# Patient Record
Sex: Male | Born: 1995 | Race: Black or African American | Hispanic: No | Marital: Single | Smoking: Current every day smoker
Health system: Southern US, Community
[De-identification: ages and names within clinical notes are randomized; demographics above are authoritative.]

## PROBLEM LIST (undated history)

## (undated) DIAGNOSIS — Q549 Hypospadias, unspecified: Secondary | ICD-10-CM

## (undated) DIAGNOSIS — J45909 Unspecified asthma, uncomplicated: Secondary | ICD-10-CM

## (undated) HISTORY — PX: HYPOSPADIAS CORRECTION: SHX483

## (undated) HISTORY — PX: COSMETIC SURGERY: SHX468

## (undated) HISTORY — PX: FINGER SURGERY: SHX640

---
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
## 2015-03-10 ENCOUNTER — Encounter (HOSPITAL_COMMUNITY): Payer: Self-pay | Admitting: Emergency Medicine

## 2015-03-10 ENCOUNTER — Emergency Department (INDEPENDENT_AMBULATORY_CARE_PROVIDER_SITE_OTHER)
Admission: EM | Admit: 2015-03-10 | Discharge: 2015-03-10 | Disposition: A | Discharge status: Home / Self Care | Payer: Medicaid Other | Source: Home / Self Care | Attending: Emergency Medicine | Admitting: Emergency Medicine

## 2015-03-10 DIAGNOSIS — J029 Acute pharyngitis, unspecified: Secondary | ICD-10-CM | POA: Diagnosis not present

## 2015-03-10 LAB — POCT RAPID STREP A: Streptococcus, Group A Screen (Direct): NEGATIVE

## 2015-03-10 MED ORDER — AMOXICILLIN 500 MG PO CAPS: 1000.0000 mg | ORAL_CAPSULE | Freq: Two times a day (BID) | ORAL | Status: DC

## 2015-03-10 NOTE — Discharge Instructions (Signed)
Pharyngitis Pharyngitis is redness, pain, and swelling (inflammation) of your pharynx.  CAUSES  Pharyngitis is usually caused by infection. Most of the time, these infections are from viruses (viral) and are part of a cold. However, sometimes pharyngitis is caused by bacteria (bacterial). Pharyngitis can also be caused by allergies. Viral pharyngitis may be spread from person to person by coughing, sneezing, and personal items or utensils (cups, forks, spoons, toothbrushes). Bacterial pharyngitis may be spread from person to person by more intimate contact, such as kissing.  SIGNS AND SYMPTOMS  Symptoms of pharyngitis include:   Sore throat.   Tiredness (fatigue).   Low-grade fever.   Headache.  Joint pain and muscle aches.  Skin rashes.  Swollen lymph nodes.  Plaque-like film on throat or tonsils (often seen with bacterial pharyngitis). DIAGNOSIS  Your health care provider will ask you questions about your illness and your symptoms. Your medical history, along with a physical exam, is often all that is needed to diagnose pharyngitis. Sometimes, a rapid strep test is done. Other lab tests may also be done, depending on the suspected cause.  TREATMENT  Viral pharyngitis will usually get better in 3-4 days without the use of medicine. Bacterial pharyngitis is treated with medicines that kill germs (antibiotics).  HOME CARE INSTRUCTIONS   Drink enough water and fluids to keep your urine clear or pale yellow.   Only take over-the-counter or prescription medicines as directed by your health care provider:   If you are prescribed antibiotics, make sure you finish them even if you start to feel better.   Do not take aspirin.   Get lots of rest.   Gargle with 8 oz of salt water ( tsp of salt per 1 qt of water) as often as every 1-2 hours to soothe your throat.   Throat lozenges (if you are not at risk for choking) or sprays may be used to soothe your throat. SEEK MEDICAL  CARE IF:   You have large, tender lumps in your neck.  You have a rash.  You cough up green, yellow-brown, or bloody spit. SEEK IMMEDIATE MEDICAL CARE IF:   Your neck becomes stiff.  You drool or are unable to swallow liquids.  You vomit or are unable to keep medicines or liquids down.  You have severe pain that does not go away with the use of recommended medicines.  You have trouble breathing (not caused by a stuffy nose). MAKE SURE YOU:   Understand these instructions.  Will watch your condition.  Will get help right away if you are not doing well or get worse.   This information is not intended to replace advice given to you by your health care provider. Make sure you discuss any questions you have with your health care provider.   Document Released: 03/21/2005 Document Revised: 01/09/2013 Document Reviewed: 11/26/2012 Elsevier Interactive Patient Education 2016 [employer]  Sore Throat A sore throat is pain, burning, irritation, or scratchiness of the throat. There is often pain or tenderness when swallowing or talking. A sore throat may be accompanied by other symptoms, such as coughing, sneezing, fever, and swollen neck glands. A sore throat is often the first sign of another sickness, such as a cold, flu, strep throat, or mononucleosis (commonly known as mono). Most sore throats go away without medical treatment. CAUSES  The most common causes of a sore throat include:  A viral infection, such as a cold, flu, or mono.  A bacterial infection, such as strep throat,  tonsillitis, or whooping cough.  Seasonal allergies.  Dryness in the air.  Irritants, such as smoke or pollution.  Gastroesophageal reflux disease (GERD). HOME CARE INSTRUCTIONS   Only take over-the-counter medicines as directed by your caregiver.  Drink enough fluids to keep your urine clear or pale yellow.  Rest as needed.  Try using throat sprays, lozenges, or sucking on hard candy to ease  any pain (if older than 4 years or as directed).  Sip warm liquids, such as broth, herbal tea, or warm water with honey to relieve pain temporarily. You may also eat or drink cold or frozen liquids such as frozen ice pops.  Gargle with salt water (mix 1 tsp salt with 8 oz of water).  Do not smoke and avoid secondhand smoke.  Put a cool-mist humidifier in your bedroom at night to moisten the air. You can also turn on a hot shower and sit in the bathroom with the door closed for 5-10 minutes. SEEK IMMEDIATE MEDICAL CARE IF:  You have difficulty breathing.  You are unable to swallow fluids, soft foods, or your saliva.  You have increased swelling in the throat.  Your sore throat does not get better in 7 days.  You have nausea and vomiting.  You have a fever or persistent symptoms for more than 2-3 days.  You have a fever and your symptoms suddenly get worse. MAKE SURE YOU:   Understand these instructions.  Will watch your condition.  Will get help right away if you are not doing well or get worse.   This information is not intended to replace advice given to you by your health care provider. Make sure you discuss any questions you have with your health care provider.   Document Released: 04/28/2004 Document Revised: 04/11/2014 Document Reviewed: 11/27/2011 Elsevier Interactive Patient Education 2016 [employer]  Upper Respiratory Infection, Adult Most upper respiratory infections (URIs) are a viral infection of the air passages leading to the lungs. A URI affects the nose, throat, and upper air passages. The most common type of URI is nasopharyngitis and is typically referred to as "the common cold." URIs run their course and usually go away on their own. Most of the time, a URI does not require medical attention, but sometimes a bacterial infection in the upper airways can follow a viral infection. This is called a secondary infection. Sinus and middle ear infections are  common types of secondary upper respiratory infections. Bacterial pneumonia can also complicate a URI. A URI can worsen asthma and chronic obstructive pulmonary disease (COPD). Sometimes, these complications can require emergency medical care and may be life threatening.  CAUSES Almost all URIs are caused by viruses. A virus is a type of germ and can spread from one person to another.  RISKS FACTORS You may be at risk for a URI if:   You smoke.   You have chronic heart or lung disease.  You have a weakened defense (immune) system.   You are very young or very old.   You have nasal allergies or asthma.  You work in crowded or poorly ventilated areas.  You work in health care facilities or schools. SIGNS AND SYMPTOMS  Symptoms typically develop 2-3 days after you come in contact with a cold virus. Most viral URIs last 7-10 days. However, viral URIs from the influenza virus (flu virus) can last 14-18 days and are typically more severe. Symptoms may include:   Runny or stuffy (congested) nose.   Sneezing.  Cough.   Sore throat.   Headache.   Fatigue.   Fever.   Loss of appetite.   Pain in your forehead, behind your eyes, and over your cheekbones (sinus pain).  Muscle aches.  DIAGNOSIS  Your health care provider may diagnose a URI by:  Physical exam.  Tests to check that your symptoms are not due to another condition such as:  Strep throat.  Sinusitis.  Pneumonia.  Asthma. TREATMENT  A URI goes away on its own with time. It cannot be cured with medicines, but medicines may be prescribed or recommended to relieve symptoms. Medicines may help:  Reduce your fever.  Reduce your cough.  Relieve nasal congestion. HOME CARE INSTRUCTIONS   Take medicines only as directed by your health care provider.   Gargle warm saltwater or take cough drops to comfort your throat as directed by your health care provider.  Use a warm mist humidifier or inhale  steam from a shower to increase air moisture. This may make it easier to breathe.  Drink enough fluid to keep your urine clear or pale yellow.   Eat soups and other clear broths and maintain good nutrition.   Rest as needed.   Return to work when your temperature has returned to normal or as your health care provider advises. You may need to stay home longer to avoid infecting others. You can also use a face mask and careful hand washing to prevent spread of the virus.  Increase the usage of your inhaler if you have asthma.   Do not use any tobacco products, including cigarettes, chewing tobacco, or electronic cigarettes. If you need help quitting, ask your health care provider. PREVENTION  The best way to protect yourself from getting a cold is to practice good hygiene.   Avoid oral or hand contact with people with cold symptoms.   Wash your hands often if contact occurs.  There is no clear evidence that vitamin C, vitamin E, echinacea, or exercise reduces the chance of developing a cold. However, it is always recommended to get plenty of rest, exercise, and practice good nutrition.  SEEK MEDICAL CARE IF:   You are getting worse rather than better.   Your symptoms are not controlled by medicine.   You have chills.  You have worsening shortness of breath.  You have brown or red mucus.  You have yellow or brown nasal discharge.  You have pain in your face, especially when you bend forward.  You have a fever.  You have swollen neck glands.  You have pain while swallowing.  You have white areas in the back of your throat. SEEK IMMEDIATE MEDICAL CARE IF:   You have severe or persistent:  Headache.  Ear pain.  Sinus pain.  Chest pain.  You have chronic lung disease and any of the following:  Wheezing.  Prolonged cough.  Coughing up blood.  A change in your usual mucus.  You have a stiff neck.  You have changes in  your:  Vision.  Hearing.  Thinking.  Mood. MAKE SURE YOU:   Understand these instructions.  Will watch your condition.  Will get help right away if you are not doing well or get worse.   This information is not intended to replace advice given to you by your health care provider. Make sure you discuss any questions you have with your health care provider.   Document Released: 09/14/2000 Document Revised: 08/05/2014 Document Reviewed: 06/26/2013 Elsevier Interactive Patient Education 2016 Elsevier  Inc. ° °

## 2015-03-10 NOTE — ED Notes (Signed)
C/o intermittent ST onset 12/01 associated w/neck pain and HA Currently, pt denies pain A&O x4... No acute distress.

## 2015-03-10 NOTE — ED Provider Notes (Signed)
CSN: [id number]     Arrival date & time 03/10/15  1850 History   First MD Initiated Contact with Patient 03/10/15 1926     Chief Complaint  Patient presents with  . Sore Throat   (Consider location/radiation/quality/duration/timing/severity/associated sxs/prior Treatment) HPI Comments: 19 year old male is complaining of a sore throat and fever. Approximate 5 days ago he developed soreness in the paracervical musculature. This was before he became ill. The fever started yesterday and the throat pain developed shortly after. Complains of fatigue and malaise, headache, neck soreness but not stiffness and sore throat.   Past Medical History  Diagnosis Date  . Hypospadias    Past Surgical History  Procedure Laterality Date  . Finger surgery    . Cosmetic surgery    . Hypospadias correction     No family history on file. Social History  Substance Use Topics  . Smoking status: Never Smoker   . Smokeless tobacco: None  . Alcohol Use: No    Review of Systems  Constitutional: Positive for fever, activity change, appetite change and fatigue. Negative for diaphoresis.  HENT: Positive for postnasal drip and sore throat. Negative for ear pain, facial swelling, rhinorrhea and trouble swallowing.   Eyes: Negative for pain, discharge and redness.  Respiratory: Positive for cough. Negative for chest tightness and shortness of breath.   Cardiovascular: Negative.   Gastrointestinal: Negative.   Genitourinary: Negative.   Musculoskeletal: Negative.  Negative for neck pain and neck stiffness.  Skin: Negative for rash.  Neurological: Negative.     Allergies  Review of patient's allergies indicates no known allergies.  Home Medications   Prior to Admission medications   Medication Sig Start Date End Date Taking? Authorizing Provider  amoxicillin (AMOXIL) 500 MG capsule Take 2 capsules (1,000 mg total) by mouth 2 (two) times daily. 03/10/15   Hayden Rasmussenavid Dulcemaria Bula, NP  cetirizine (ZYRTEC ALLERGY) 10 MG  tablet Take 1 tablet (10 mg total) by mouth daily. 09/13/13   Kaitlyn Szekalski, PA-C  ibuprofen (ADVIL,MOTRIN) 600 MG tablet Take 1 tablet (600 mg total) by mouth every 6 (six) hours as needed. 09/13/13   Emilia BeckKaitlyn Szekalski, PA-C   Meds Ordered and Administered this Visit  Medications - No data to display  BP 126/75 mmHg  Pulse 75  Temp(Src) 102.1 F (38.9 C) (Oral)  Resp 16  SpO2 100% No data found.   Physical Exam  Constitutional: He is oriented to person, place, and time. He appears well-developed and well-nourished. No distress.  HENT:  Mouth/Throat: Oropharyngeal exudate present.  Bilateral TMs are normal. Oropharynx with deep erythema, mild swelling, tonsillar exudates and curtains of gray thick PND.   Neck: Normal range of motion. Neck supple.  Bilateral anterior cervical adenopathy  Cardiovascular: Normal rate, regular rhythm and normal heart sounds.   Pulmonary/Chest: Effort normal and breath sounds normal. No respiratory distress. He has no wheezes. He has no rales.  Musculoskeletal: Normal range of motion. He exhibits no edema.  Lymphadenopathy:    He has cervical adenopathy.  Neurological: He is alert and oriented to person, place, and time.  Skin: Skin is warm and dry. No rash noted.  Psychiatric: He has a normal mood and affect.  Nursing note and vitals reviewed.   ED Course  Procedures (including critical care time)  Labs Review Labs Reviewed - No data to display  Imaging Review No results found.   Visual Acuity Review  Right Eye Distance:   Left Eye Distance:   Bilateral Distance:    Right  Eye Near:   Left Eye Near:    Bilateral Near:         MDM   1. Exudative pharyngitis    Patient with exudative pharyngitis associated with fever of 102.1, anterior lymphadenopathy treated with amoxicillin, Cepacol lozenges, ibuprofen 600 mg every 6-8 hours and lots of fluids.       Hayden Rasmussen, NP 03/10/15 [phone]

## 2015-03-14 LAB — CULTURE, GROUP A STREP

## 2015-07-31 ENCOUNTER — Ambulatory Visit (HOSPITAL_COMMUNITY)
Admission: EM | Admit: 2015-07-31 | Discharge: 2015-07-31 | Disposition: A | Discharge status: Home / Self Care | Payer: Self-pay | Attending: Family Medicine | Admitting: Family Medicine

## 2015-07-31 ENCOUNTER — Ambulatory Visit (INDEPENDENT_AMBULATORY_CARE_PROVIDER_SITE_OTHER): Payer: Self-pay

## 2015-07-31 ENCOUNTER — Encounter (HOSPITAL_COMMUNITY): Payer: Self-pay

## 2015-07-31 DIAGNOSIS — K219 Gastro-esophageal reflux disease without esophagitis: Secondary | ICD-10-CM

## 2015-07-31 DIAGNOSIS — R0789 Other chest pain: Secondary | ICD-10-CM

## 2015-07-31 MED ORDER — OMEPRAZOLE 20 MG PO TBEC: 20.0000 mg | DELAYED_RELEASE_TABLET | Freq: Every day | ORAL | Status: DC

## 2015-07-31 NOTE — Discharge Instructions (Signed)
It is a pleasure to see you today.  The chest x-ray does not show any abnormalities or rib fractures that would explain your chest wall symptoms.   Regarding the throat symptoms, I believe there may be a component that is related to your acid reflux.  I would like to you to take an acid-suppressing medication, OMEPRAZOLE 20mg  by mouth once daily, for the coming 2 weeks.   Follow up with your newly established primary care doctor for these issues, and to see if the acid blocking medicine has helped.

## 2015-07-31 NOTE — ED Notes (Signed)
Patient states he has a funny feeling in his chest and his tonsils feel swollen x2 days. Patient states there is no pain just a funny feeling as if he has to take a deep breath just to get some air. No acute distress

## 2015-07-31 NOTE — ED Provider Notes (Addendum)
CSN: [id number]     Arrival date & time 07/31/15  1336 History   First MD Initiated Contact with Patient 07/31/15 1512     Chief Complaint  Patient presents with  . Chest Pain   (Consider location/radiation/quality/duration/timing/severity/associated sxs/prior Treatment) Patient is a 20 y.o. male presenting with chest pain. The history is provided by the patient. No language interpreter was used.  Chest Pain Associated symptoms: cough   Associated symptoms: no diaphoresis, no dizziness, no fatigue, no fever, no headache, no nausea, no numbness, no shortness of breath and not vomiting   Patient presents with complaint of left-sided chest wall soreness ("strange feeling"), not truly painful but is uncomfortable.  Has been present since @February  2017, no clear onset or trigger to the complaint. He plays college basketball and believes he may have been hit while playing but does not recall a specific event.  Will have occasional dry cough, sometimes productive of clear sputum, no blood. No dyspnea.  No history lung disease. Nonsmoker.  No fevers/chills/sweats.    He also notes a "feeling like my throat is swollen", not a sore or painful throat. No hoarseness or change in phonation, no aspiration, no dysphagia or odynophagia.  Eating normally, appetite normal.   Has had some nasal congestion; also long history GERD symptoms which have not changed since childhood.   No chronic medications, NKDA.   Past Medical History  Diagnosis Date  . Hypospadias    Past Surgical History  Procedure Laterality Date  . Finger surgery    . Cosmetic surgery    . Hypospadias correction     No family history on file. Social History  Substance Use Topics  . Smoking status: Never Smoker   . Smokeless tobacco: Never Used  . Alcohol Use: No    Review of Systems  Constitutional: Negative for fever, chills, diaphoresis, appetite change and fatigue.  HENT: Positive for congestion. Negative for ear discharge,  ear pain, sinus pressure and sore throat.   Respiratory: Positive for cough. Negative for apnea, choking, chest tightness, shortness of breath, wheezing and stridor.   Cardiovascular: Positive for chest pain.  Gastrointestinal: Negative for nausea, vomiting, diarrhea and constipation.  Neurological: Negative for dizziness, numbness and headaches.    Allergies  Review of patient's allergies indicates no known allergies.  Home Medications   Prior to Admission medications   Medication Sig Start Date End Date Taking? Authorizing Provider  amoxicillin (AMOXIL) 500 MG capsule Take 2 capsules (1,000 mg total) by mouth 2 (two) times daily. 03/10/15   Hayden Rasmussenavid Mabe, NP  cetirizine (ZYRTEC ALLERGY) 10 MG tablet Take 1 tablet (10 mg total) by mouth daily. 09/13/13   Kaitlyn Szekalski, PA-C  ibuprofen (ADVIL,MOTRIN) 600 MG tablet Take 1 tablet (600 mg total) by mouth every 6 (six) hours as needed. 09/13/13   Emilia BeckKaitlyn Szekalski, PA-C   Meds Ordered and Administered this Visit  Medications - No data to display  BP 145/91 mmHg  Pulse 60  Temp(Src) 98.3 F (36.8 C) (Oral)  Resp 12  SpO2 100% No data found.   Physical Exam  Constitutional: He appears well-developed and well-nourished. No distress.  HENT:  Head: Normocephalic and atraumatic.  Right Ear: External ear normal.  Left Ear: External ear normal.  Mouth/Throat: Oropharynx is clear and moist. No oropharyngeal exudate.  Boggy nasal mucosa. More so in the R than L.   No frontal or maxillary sinus tenderness.   Eyes: Conjunctivae and EOM are normal. Pupils are equal, round, and reactive to  light. Right eye exhibits no discharge. Left eye exhibits no discharge. No scleral icterus.  Neck: Normal range of motion. Neck supple. No tracheal deviation present. No thyromegaly present.  Cardiovascular: Normal rate, regular rhythm and normal heart sounds.  Exam reveals no gallop and no friction rub.   No murmur heard. Pulmonary/Chest: Effort normal and  breath sounds normal. No respiratory distress. He has no wheezes. He has no rales.  Question mild tenderness along anterior aspect of L chest wall at @ rib 6-8. No step-off, no erythema or skin changes.   Good air movement on auscultation throughout lung fields bilaterally.    Abdominal: Soft. Bowel sounds are normal. He exhibits no distension and no mass. There is no tenderness. There is no rebound and no guarding.  Lymphadenopathy:    He has no cervical adenopathy.  Skin: He is not diaphoretic.    ED Course  Procedures (including critical care time)  Labs Review Labs Reviewed - No data to display  Imaging Review No results found.   Visual Acuity Review  Right Eye Distance:   Left Eye Distance:   Bilateral Distance:    Right Eye Near:   Left Eye Near:    Bilateral Near:         MDM   1. Left-sided chest wall pain   2. Gastroesophageal reflux disease without esophagitis    Patient with L sided chest soreness.  CXR today in [facility] (reviewed by me) shows no bony or pulmonary abnormalities to explain his sxs.   Throat sensation, may be related to chronic GERD versus allergic.  Trial PPI and follow up with PCP (he is going to establish today).  He tells me he is establishing a Primary physician today; his mother has a list of doctors for him to call.  Plan to follow up for both of these problems with his new PCP, or to return to [facility] as needed if worsens/new problems develop.       Barbaraann Barthel, MD 07/31/15 1534  Barbaraann Barthel, MD 07/31/15 [phone]

## 2017-02-09 ENCOUNTER — Emergency Department (HOSPITAL_COMMUNITY)
Admission: EM | Admit: 2017-02-09 | Discharge: 2017-02-10 | Disposition: A | Discharge status: Home / Self Care | Payer: Self-pay | Attending: Emergency Medicine | Admitting: Emergency Medicine

## 2017-02-09 ENCOUNTER — Encounter (HOSPITAL_COMMUNITY): Payer: Self-pay | Admitting: Emergency Medicine

## 2017-02-09 DIAGNOSIS — Z202 Contact with and (suspected) exposure to infections with a predominantly sexual mode of transmission: Secondary | ICD-10-CM | POA: Insufficient documentation

## 2017-02-09 DIAGNOSIS — Q386 Other congenital malformations of mouth: Secondary | ICD-10-CM

## 2017-02-09 DIAGNOSIS — F1290 Cannabis use, unspecified, uncomplicated: Secondary | ICD-10-CM | POA: Insufficient documentation

## 2017-02-09 DIAGNOSIS — N4889 Other specified disorders of penis: Secondary | ICD-10-CM | POA: Insufficient documentation

## 2017-02-09 DIAGNOSIS — Z711 Person with feared health complaint in whom no diagnosis is made: Secondary | ICD-10-CM

## 2017-02-09 DIAGNOSIS — J45909 Unspecified asthma, uncomplicated: Secondary | ICD-10-CM | POA: Insufficient documentation

## 2017-02-09 HISTORY — DX: Unspecified asthma, uncomplicated: J45.909

## 2017-02-09 LAB — URINALYSIS, ROUTINE W REFLEX MICROSCOPIC
Bilirubin Urine: NEGATIVE
Glucose, UA: NEGATIVE mg/dL
Hgb urine dipstick: NEGATIVE
Ketones, ur: NEGATIVE mg/dL
Leukocytes, UA: NEGATIVE
Nitrite: NEGATIVE
Protein, ur: NEGATIVE mg/dL
Specific Gravity, Urine: 1.028 (ref 1.005–1.030)
pH: 6 (ref 5.0–8.0)

## 2017-02-09 NOTE — ED Triage Notes (Signed)
Pt requesting STD screen, denies discharge or painful urination. Unsure if he has come in contact with anyone who has an STD, but he has noticed "bumps" on his genitals.

## 2017-02-10 LAB — HIV ANTIBODY (ROUTINE TESTING W REFLEX): HIV Screen 4th Generation wRfx: NONREACTIVE

## 2017-02-10 LAB — RPR: RPR Ser Ql: NONREACTIVE

## 2017-02-10 NOTE — ED Provider Notes (Signed)
[facility] Note   CSN: [id number] Arrival date & time: 02/09/17  2202     History   Chief Complaint Chief Complaint  Patient presents with  . Exposure to STD    HPI Johnny Garza is a 21 y.o. male.  Johnny KluverMason D Garza is a 21 y.o. Male who presents to the ED complaining of some bumps on his penis for about the past 5 months.  He reports they are not painful or itchy.  He has no symptoms with them.  Sometimes he can feel them on the underside of his penis.  He denies other rashes or lesions.  No penile discharge.  No testicular pain.  He reports he is sexually active and does not use protection.  He denies history of STD.  He denies fevers, mouth sores, penile discharge, dysuria, penile or testicular pain.   The history is provided by the patient and medical records. No language interpreter was used.  Exposure to STD  Pertinent negatives include no abdominal pain.    Past Medical History:  Diagnosis Date  . Asthma   . Hypospadias     There are no active problems to display for this patient.   Past Surgical History:  Procedure Laterality Date  . COSMETIC SURGERY    . FINGER SURGERY    . HYPOSPADIAS CORRECTION         Home Medications    Prior to Admission medications   Not on File    Family History No family history on file.  Social History Social History   Tobacco Use  . Smoking status: Never Smoker  . Smokeless tobacco: Never Used  Substance Use Topics  . Alcohol use: No  . Drug use: Yes    Types: Marijuana     Allergies   Patient has no known allergies.   Review of Systems Review of Systems  Constitutional: Negative for fever.  HENT: Negative for mouth sores.   Respiratory: Negative for cough.   Gastrointestinal: Negative for abdominal pain, nausea and rectal pain.  Genitourinary: Negative for difficulty urinating, discharge, dysuria, hematuria, penile pain, penile swelling, scrotal swelling,  testicular pain and urgency.  Skin: Positive for rash. Negative for color change.     Physical Exam Updated Vital Signs BP (!) 161/89 (BP Location: Right Arm)   Pulse 100   Temp 98.3 F (36.8 C) (Oral)   Resp 16   Ht 5\' 11"  (1.803 m)   Wt 68 kg (150 lb)   SpO2 100%   BMI 20.92 kg/m   Physical Exam  Constitutional: He appears well-developed and well-nourished. No distress.  HENT:  Head: Normocephalic and atraumatic.  Mouth/Throat: Oropharynx is clear and moist.  Eyes: Right eye exhibits no discharge. Left eye exhibits no discharge.  Neck: Neck supple.  Cardiovascular: Normal rate, regular rhythm and normal heart sounds.  Pulmonary/Chest: Effort normal and breath sounds normal. No respiratory distress.  Abdominal: Soft. There is no tenderness. There is no guarding.  Genitourinary: Penis normal. No penile tenderness.  Genitourinary Comments: GU exam with male RN as chaperone.  No evidence of any lesions or rashes.  Possible slight fordyce spots on the underside of his penis. He is unable to point out the bumps that he came in for. No vesicles.  No penile or testicular tenderness to palpation.  No penile discharge.  Lymphadenopathy:    He has no cervical adenopathy.  Neurological: He is alert. Coordination normal.  Skin: Skin is warm and  dry. No rash noted. He is not diaphoretic. No erythema. No pallor.  Psychiatric: He has a normal mood and affect. His behavior is normal.  Nursing note and vitals reviewed.    ED Treatments / Results  Labs (all labs ordered are listed, but only abnormal results are displayed) Labs Reviewed  URINALYSIS, ROUTINE W REFLEX MICROSCOPIC  RPR  HIV ANTIBODY (ROUTINE TESTING)  GC/CHLAMYDIA PROBE AMP ([place]    EKG  EKG Interpretation None       Radiology No results found.  Procedures Procedures (including critical care time)  Medications Ordered in ED Medications - No data to display   Initial Impression /  Assessment and Plan / ED Course  I have reviewed the triage vital signs and the nursing notes.  Pertinent labs & imaging results that were available during my care of the patient were reviewed by me and considered in my medical decision making (see chart for details).    This is a 21 y.o. Male who presents to the ED complaining of some bumps on his penis for about the past 5 months.  He reports they are not painful or itchy.  He has no symptoms with them.  Sometimes he can feel them on the underside of his penis.  He denies other rashes or lesions.  No penile discharge.  No testicular pain.  He reports he is sexually active and does not use protection.  He denies history of STD. On exam the patient is afebrile nontoxic-appearing.  His abdomen is soft and nontender to palpation.  No mouth sores noted.  On GU exam patient possibly has some fordyce spots.  No vesicles.  No penile or testicular tenderness to palpation.  No penile discharge.  No evidence of syphilis or herpes.  He has no symptoms of an STD currently.  I see no need for any treatment at this time.  I reassured him about these fordyce spots. he agrees to be checked for HIV, syphilis, gonorrhea and chlamydia.  I encouraged him to follow-up on these test results in about 3 days and my chart.  I discussed extensively safe sex practices with the patient.  I encouraged him to always use a condom.  I encouraged him to follow-up the health department for routine STD examinations.  Patient agrees with plan. I advised the patient to follow-up with their primary care provider this week. I advised the patient to return to the emergency department with new or worsening symptoms or new concerns. The patient verbalized understanding and agreement with plan.    Final Clinical Impressions(s) / ED Diagnoses   Final diagnoses:  Concern about STD in male without diagnosis  Fordyce spots    ED Discharge Orders    None       Everlene FarrierDansie, Bedelia Pong,  PA-C 02/10/17 0106    Zadie RhineWickline, Donald, MD 02/10/17 [phone]

## 2017-02-10 NOTE — Discharge Instructions (Signed)
Fordyce spots are whitish-yellow bumps that can occur on the edge of your lips or inside your cheeks. Less often, they can appear on your penis or scrotum if you're male or your labia if you're male. The spots, also called Fordyce granules or Fordyce glands, are enlarged oil glands. These are normal and not evidence of an STD. Please follow up on STD test results on Mychart in about 3 days. Please go to the health department for future STD checks.

## 2017-06-18 ENCOUNTER — Ambulatory Visit (HOSPITAL_COMMUNITY)
Admission: EM | Admit: 2017-06-18 | Discharge: 2017-06-18 | Disposition: A | Discharge status: Home / Self Care | Payer: Medicaid Other

## 2017-06-18 ENCOUNTER — Encounter (HOSPITAL_COMMUNITY): Payer: Self-pay | Admitting: Emergency Medicine

## 2017-06-18 DIAGNOSIS — K1370 Unspecified lesions of oral mucosa: Secondary | ICD-10-CM

## 2017-06-18 NOTE — Discharge Instructions (Signed)
Bumps appear to be normal taste buds, it is normal for them to change over time. Tongue not appear dangerous or infected at this time. Please continue to monitor.   Please return if symptoms changing, develop pain, or worsening.

## 2017-06-18 NOTE — ED Provider Notes (Signed)
[facility]    CSN: [id number] Arrival date & time: 06/18/17  1249     History   Chief Complaint Chief Complaint  Patient presents with  . Tongue spots    HPI Priscille KluverMason D Maenza is a 22 y.o. male no significant past medical history presenting today with concern for spots on his tongue.  States that he has noticed some larger bumps at the back of his tongue as well as a white discoloration.  Denies any pain.  Denies any sore throat.  Patient does not take any medicines or use any inhalers.  Patient does smoke black in miles, but denies smoking cigarettes.  Just noticed spots yesterday.  Denies changing since then.  HPI  Past Medical History:  Diagnosis Date  . Asthma   . Hypospadias     There are no active problems to display for this patient.   Past Surgical History:  Procedure Laterality Date  . COSMETIC SURGERY    . FINGER SURGERY    . HYPOSPADIAS CORRECTION         Home Medications    Prior to Admission medications   Not on File    Family History No family history on file.  Social History Social History   Tobacco Use  . Smoking status: Never Smoker  . Smokeless tobacco: Never Used  Substance Use Topics  . Alcohol use: No  . Drug use: Yes    Types: Marijuana     Allergies   Patient has no known allergies.   Review of Systems Review of Systems  Constitutional: Negative for appetite change, chills, fatigue and fever.  HENT: Positive for mouth sores. Negative for ear pain and sore throat.   Respiratory: Negative for shortness of breath.   Skin: Negative for color change and rash.  Neurological: Negative for dizziness, light-headedness, numbness and headaches.  All other systems reviewed and are negative.    Physical Exam Triage Vital Signs ED Triage Vitals [06/18/17 1419]  Enc Vitals Group     BP (!) 147/78     Pulse Rate 60     Resp 16     Temp 98.1 F (36.7 C)     Temp src      SpO2 100 %     Weight      Height      Head  Circumference      Peak Flow      Pain Score      Pain Loc      Pain Edu?      Excl. in GC?    No data found.  Updated Vital Signs BP (!) 147/78   Pulse 60   Temp 98.1 F (36.7 C)   Resp 16   SpO2 100%   Visual Acuity Right Eye Distance:   Left Eye Distance:   Bilateral Distance:    Right Eye Near:   Left Eye Near:    Bilateral Near:     Physical Exam  Constitutional: He appears well-developed and well-nourished.  HENT:  Head: Normocephalic and atraumatic.  Oropharynx clear and moist, no tonsillar enlargement or erythema.  Tongue with larger taste but like bumps to posterior aspect, occasional bumps with wider appearance then the rest.  2 small bumps to left lateral aspect of tongue.  No sores observed on buccal mucosa or lips.  Eyes: Conjunctivae are normal.  Neck: Neck supple.  Cardiovascular: Normal rate and regular rhythm.  No murmur heard. Pulmonary/Chest: Effort normal and breath sounds normal.  No respiratory distress.  Abdominal: Soft. There is no tenderness.  Musculoskeletal: He exhibits no edema.  Neurological: He is alert.  Skin: Skin is warm and dry.  Psychiatric: He has a normal mood and affect.  Nursing note and vitals reviewed.    UC Treatments / Results  Labs (all labs ordered are listed, but only abnormal results are displayed) Labs Reviewed - No data to display  EKG  EKG Interpretation None       Radiology No results found.  Procedures Procedures (including critical care time)  Medications Ordered in UC Medications - No data to display   Initial Impression / Assessment and Plan / UC Course  I have reviewed the triage vital signs and the nursing notes.  Pertinent labs & imaging results that were available during my care of the patient were reviewed by me and considered in my medical decision making (see chart for details).     Patient without signs of infection or mouth lesions.  Bumps seem to be taste buds.  Reassured patient.   We will continue to monitor. Discussed strict return precautions. Patient verbalized understanding and is agreeable with plan.   Final Clinical Impressions(s) / UC Diagnoses   Final diagnoses:  Mouth problem    ED Discharge Orders    None       Controlled Substance Prescriptions [place] Controlled Substance Registry consulted? Not Applicable   Lew Dawes, [place] 06/18/17 1452

## 2017-06-18 NOTE — ED Triage Notes (Signed)
Pt noticed white spots on his tongue and some bumps yesterday. No pain.

## 2017-06-21 ENCOUNTER — Encounter (HOSPITAL_COMMUNITY): Payer: Self-pay | Admitting: Family Medicine

## 2017-06-21 ENCOUNTER — Emergency Department (HOSPITAL_COMMUNITY)
Admission: EM | Admit: 2017-06-21 | Discharge: 2017-06-21 | Disposition: A | Discharge status: Home / Self Care | Payer: Self-pay | Attending: Emergency Medicine | Admitting: Emergency Medicine

## 2017-06-21 DIAGNOSIS — Z711 Person with feared health complaint in whom no diagnosis is made: Secondary | ICD-10-CM | POA: Insufficient documentation

## 2017-06-21 DIAGNOSIS — Z202 Contact with and (suspected) exposure to infections with a predominantly sexual mode of transmission: Secondary | ICD-10-CM | POA: Insufficient documentation

## 2017-06-21 DIAGNOSIS — J45909 Unspecified asthma, uncomplicated: Secondary | ICD-10-CM | POA: Insufficient documentation

## 2017-06-21 NOTE — ED Triage Notes (Signed)
Patient reports he has bumps on the head of his penis since November. Denies penile discharge and dysuria. Patient denies he has had intercourse since November. Furthermore, he reports he was tested in November for STD and didn't receive any results that he was positive.

## 2017-06-21 NOTE — ED Provider Notes (Signed)
[facility]EMERGENCY DEPT Provider Note   CSN: [id number] Arrival date & time: 06/21/17  1901      History   Chief Complaint Chief Complaint  Patient presents with  . SEXUALLY TRANSMITTED DISEASE    HPI Johnny Garza is a 22 y.o. male.  Johnny Garza is a 23 y.o. Male with a history of asthma, presents to ED for evaluation of bumps on the head of his penis since November. Bumps are not painful, no vesicular lesions. Patient reports he presented to ED for evaluation of the same and STD testing in November and was never called with any positive results. Pt reports he has not been sexually active since that time. Pt reports he may have had one episode of penile discharge a few days ago, but isn't sure, denies any dysuria or urinary frequency. Pt denies any fevers or chills, no N/V/D, no abdominal pain. Pt denies any rectal pain or pain with defecation. On review of chart it appears pt did have STD testing in November, but GC/chlamydia was never completed. Pt tested negative for syphilis and HIV, and there was no evidence of trichomonas in urine.     Past Medical History:  Diagnosis Date  . Asthma   . Hypospadias     There are no active problems to display for this patient.   Past Surgical History:  Procedure Laterality Date  . COSMETIC SURGERY    . FINGER SURGERY    . HYPOSPADIAS CORRECTION         Home Medications    Prior to Admission medications   Not on File    Family History History reviewed. No pertinent family history.  Social History Social History   Tobacco Use  . Smoking status: Never Smoker  . Smokeless tobacco: Never Used  Substance Use Topics  . Alcohol use: No  . Drug use: Yes    Types: Marijuana    Comment: Daily.      Allergies   Patient has no known allergies.   Review of Systems Review of Systems  Constitutional: Negative for chills and fever.  Gastrointestinal: Negative for abdominal pain, anal bleeding,  constipation, diarrhea, nausea, rectal pain and vomiting.  Genitourinary: Negative for discharge, dysuria, frequency, penile pain, penile swelling, scrotal swelling and testicular pain.  Skin: Negative for rash.     Physical Exam Updated Vital Signs BP (!) 141/87 (BP Location: Left Arm)   Pulse (!) 111   Temp 98.1 F (36.7 C) (Oral)   Resp 16   Ht 5\' 11"  (1.803 m)   Wt 68 kg (150 lb)   SpO2 100%   BMI 20.92 kg/m   Physical Exam  Constitutional: He appears well-developed and well-nourished. No distress.  HENT:  Head: Normocephalic and atraumatic.  Mouth/Throat: Oropharynx is clear and moist.  Eyes: Right eye exhibits no discharge. Left eye exhibits no discharge.  Pulmonary/Chest: Effort normal. No respiratory distress.  Abdominal: Soft. Bowel sounds are normal. He exhibits no distension and no mass. There is no tenderness. There is no guarding.  Genitourinary:  Genitourinary Comments: Chaperone present during genital exam. No external genital lesions noted, no bumps on head of penis, specifically no vesicles concerning for herpes or chancre suggestive of syphillis, no pain with palpation, no discharge or urethritis noted, scrotum and testicles w/o erythema or swelling, NTTP  Neurological: He is alert. Coordination normal.  Skin: Skin is warm and dry. Capillary refill takes less than 2 seconds. He is not diaphoretic.  Psychiatric: He  has a normal mood and affect. His behavior is normal.  Nursing note and vitals reviewed.    ED Treatments / Results  Labs (all labs ordered are listed, but only abnormal results are displayed) Labs Reviewed  GC/CHLAMYDIA PROBE AMP ([place]) NOT [facility]    EKG  EKG Interpretation None       Radiology No results found.  Procedures Procedures (including critical care time)  Medications Ordered in ED Medications - No data to display   Initial Impression / Assessment and Plan / ED Course  I have reviewed the triage vital signs  and the nursing notes.  Pertinent labs & imaging results that were available during my care of the patient were reviewed by me and considered in my medical decision making (see chart for details).  Patient is afebrile without abdominal tenderness, abdominal pain or painful bowel movements to indicate prostatitis.  No tenderness to palpation of the testes or epididymis to suggest orchitis or epididymitis.  STD cultures obtained gonorrhea and chlamydia, pt has negative HIV and RPR and has not been sexually active since. Patient to be discharged with instructions to follow up with PCP. Discussed importance of using protection when sexually active. Pt understands that they have GC/Chlamydia cultures pending and that they will need to inform all sexual partners if results return positive. Offered prophylactic treated with rocephin and azithromycin, but patient declined.   Final Clinical Impressions(s) / ED Diagnoses   Final diagnoses:  Concern about STD in male without diagnosis    ED Discharge Orders    None       Legrand RamsFord, Jezebel Pollet N, PA-C 06/22/17 1035    Arby BarrettePfeiffer, Marcy, MD 06/25/17 [phone]

## 2017-06-21 NOTE — Discharge Instructions (Signed)
You have gonorrhea and Chlamydia testing pending and will be called in 2-3 days if these results are positive.  For any positive results for future STD testing needs you can follow-up with the health department.  Please use protection and notify any partners.

## 2017-06-22 ENCOUNTER — Other Ambulatory Visit: Payer: Self-pay

## 2017-06-22 ENCOUNTER — Encounter (HOSPITAL_COMMUNITY): Payer: Self-pay | Admitting: Emergency Medicine

## 2017-06-22 ENCOUNTER — Emergency Department (HOSPITAL_COMMUNITY)
Admission: EM | Admit: 2017-06-22 | Discharge: 2017-06-22 | Disposition: A | Discharge status: Home / Self Care | Payer: Self-pay | Attending: Emergency Medicine | Admitting: Emergency Medicine

## 2017-06-22 DIAGNOSIS — F1210 Cannabis abuse, uncomplicated: Secondary | ICD-10-CM | POA: Insufficient documentation

## 2017-06-22 DIAGNOSIS — R300 Dysuria: Secondary | ICD-10-CM | POA: Insufficient documentation

## 2017-06-22 LAB — URINALYSIS, ROUTINE W REFLEX MICROSCOPIC
BILIRUBIN URINE: NEGATIVE
Glucose, UA: NEGATIVE mg/dL
HGB URINE DIPSTICK: NEGATIVE
KETONES UR: 20 mg/dL — AB
Leukocytes, UA: NEGATIVE
NITRITE: NEGATIVE
Protein, ur: NEGATIVE mg/dL
Specific Gravity, Urine: 1.032 — ABNORMAL HIGH (ref 1.005–1.030)
pH: 6 (ref 5.0–8.0)

## 2017-06-22 LAB — GC/CHLAMYDIA PROBE AMP (~~LOC~~) NOT AT ARMC
Chlamydia: NEGATIVE
Neisseria Gonorrhea: NEGATIVE

## 2017-06-22 NOTE — Discharge Instructions (Signed)
Your gonorrhea and Chlamydia results were negative. Your urinalysis did not show evidence of infection. Your exam was reassuring. I suspect this is irritation. Please follow with your primary care doctor for all other concerns. Please follow with the health department for all future STD screening.

## 2017-06-22 NOTE — ED Triage Notes (Signed)
Pt complaint of burning and discharge; seen yesterday for same "but they didn't give me a shot; they just said they would call me in two days with results."

## 2017-06-22 NOTE — ED Provider Notes (Signed)
Johnny Garza   CSN: [id number] Arrival date & time: 06/22/17  1451     History   Chief Complaint Chief Complaint  Patient presents with  . Exposure to STD    HPI Johnny Garza is a 22 y.o. male with a history of asthma who presents the emergency department today for exposure to STD.  Patient was seen here yesterday for the same.  He reported that he presented the ED in November and was never called with positive results.  He reports he has not been sexually active since that time.  He did have one episode of penile discharge a few days ago.  Gonorrhea and Chlamydia cultures were obtained and patient refused HIV and RPR testing as he has not been sexually active since previous negative results.  Patient is presenting today again because he has started to have mild burning with urination since the swab and is concerned that he may have an STD now.  He reports that he has not been sexually active still since testing in November.  The patient denies any urinary frequency, urgency, hematuria, penile discharge, penile lesions/sores, testicular pain/swelling, scrotal lesions/swelling, abdominal pain, fever, chills, nausea/vomiting/diarrhea, painful bowel movements.  Patient denies history of STDs in the past.  HPI  Past Medical History:  Diagnosis Date  . Asthma   . Hypospadias     There are no active problems to display for this patient.   Past Surgical History:  Procedure Laterality Date  . COSMETIC SURGERY    . FINGER SURGERY    . HYPOSPADIAS CORRECTION         Home Medications    Prior to Admission medications   Not on File    Family History No family history on file.  Social History Social History   Tobacco Use  . Smoking status: Never Smoker  . Smokeless tobacco: Never Used  Substance Use Topics  . Alcohol use: No  . Drug use: Yes    Types: Marijuana    Comment: Daily.      Allergies   Patient has no  known allergies.   Review of Systems Review of Systems  All other systems reviewed and are negative.    Physical Exam Updated Vital Signs BP (!) 145/89 (BP Location: Right Arm)   Pulse 78   Temp 98.1 F (36.7 C) (Oral)   Resp 16   SpO2 99%   Physical Exam  Constitutional: He appears well-developed and well-nourished.  HENT:  Head: Normocephalic and atraumatic.  Right Ear: External ear normal.  Left Ear: External ear normal.  Eyes: Conjunctivae are normal. Right eye exhibits no discharge. Left eye exhibits no discharge. No scleral icterus.  Pulmonary/Chest: Effort normal. No respiratory distress.  Abdominal: Soft. Bowel sounds are normal. He exhibits no distension. There is tenderness. There is no rigidity, no rebound, no guarding and no CVA tenderness. Hernia confirmed negative in the right inguinal area and confirmed negative in the left inguinal area.  Genitourinary: Testes normal and penis normal. Right testis shows no mass, no swelling and no tenderness. Left testis shows no mass, no swelling and no tenderness. Circumcised. No phimosis, paraphimosis, hypospadias, penile erythema or penile tenderness. No discharge found.  Genitourinary Comments: Chaperone present during genital exam. No external genital lesions noted, no bumps on head of penis, specifically no vesicles concerning for herpes or chancre suggestive of syphillis, no pain with palpation, no discharge or urethritis noted, scrotum and testicles w/o erythema or swelling, NTTP  Neurological: He is alert.  Skin: No pallor.  Psychiatric: He has a normal mood and affect.  Nursing Garza and vitals reviewed.    ED Treatments / Results  Labs (all labs ordered are listed, but only abnormal results are displayed) Labs Reviewed - No data to display  EKG  EKG Interpretation None       Radiology No results found.  Procedures Procedures (including critical care time)  Medications Ordered in ED Medications - No  data to display   Initial Impression / Assessment and Plan / ED Course  I have reviewed the triage vital signs and the nursing notes.  Pertinent labs & imaging results that were available during my care of the patient were reviewed by me and considered in my medical decision making (see chart for details).     22 y.o. male who was seen here yesterday for concerns for STD.  Patient states that he was never called in November after being tested for gonorrhea and chlamydia.  He notes he has not been sexually active since that time.  He previously had negative results of HIV, syphilis and trichomonas.  Patient was asymptomatic yesterday elected not to receive prophylactic treatment.  Cultures were obtained for gonorrhea and chlamydia. Patient is presenting today again because he has started to have mild burning with urination since the swab and is concerned that he may have an STD now.  He reports that he still has not been sexually active still since testing in November.  The patient denies any urinary frequency, urgency, hematuria, flank pain, penile discharge, penile lesions/sores, testicular pain/swelling, scrotal lesions/swelling, abdominal pain, fever, chills, nausea/vomiting/diarrhea, painful bowel movements.  Patient denies history of STDs in the past.  Gonorrhea and Chlamydia testing from yesterday were negative.  Patient HIV/syphilis and trichomonas from November were negative.  He has not been sexually active since that time.  He does complain of some dysuria now.  May be likely secondary to the instrumentation from collecting gonorrhea and chlamydia swabs.  Will obtain urinalysis to rule out UTI.   Patient exam without concern for epididymitis or orchitis.  Urinalysis without evidence of infection.  Suspect irritation from obtaining gonorrhea and Chlamydia cultures yesterday. I advised the patient to follow-up with PCP for all further concerns. Patient given information for health department  for all further STD screens. Specific return precautions discussed. Time was given for all questions to be answered. The patient verbalized understanding and agreement with plan. The patient appears safe for discharge home.  Final Clinical Impressions(s) / ED Diagnoses   Final diagnoses:  Dysuria    ED Discharge Orders    None       Princella PellegriniMaczis, Michael M, PA-C 06/22/17 1916    Shaune PollackIsaacs, [place], MD 06/22/17 2105

## 2017-09-20 ENCOUNTER — Ambulatory Visit (HOSPITAL_COMMUNITY)
Admission: EM | Admit: 2017-09-20 | Discharge: 2017-09-20 | Disposition: A | Discharge status: Home / Self Care | Payer: Self-pay | Attending: Internal Medicine | Admitting: Internal Medicine

## 2017-09-20 ENCOUNTER — Encounter (HOSPITAL_COMMUNITY): Payer: Self-pay | Admitting: Emergency Medicine

## 2017-09-20 DIAGNOSIS — M25562 Pain in left knee: Secondary | ICD-10-CM

## 2017-09-20 NOTE — ED Provider Notes (Signed)
[facility]    CSN: [id number] Arrival date & time: 09/20/17  1351     History   Chief Complaint Chief Complaint  Patient presents with  . Knee Pain    HPI Johnny Garza is a 22 y.o. male history of asthma presenting today for evaluation of left knee and heel pain.  Patient states that he was playing basketball 2 days ago, he denies any specific injury, awkward landing motion or fall to the knee on the knee.  He states that since he has had some swelling and pain with moving the knee.  He states most of his pain is above the knee, more so pain with extension instead of flexion.  States that he is having significantly difficulty walking yesterday, symptoms improved yesterday with ice.  With work is on his feet most of the day, doing heavy lifting.  HPI  Past Medical History:  Diagnosis Date  . Asthma   . Hypospadias     There are no active problems to display for this patient.   Past Surgical History:  Procedure Laterality Date  . COSMETIC SURGERY    . FINGER SURGERY    . HYPOSPADIAS CORRECTION         Home Medications    Prior to Admission medications   Not on File    Family History History reviewed. No pertinent family history.  Social History Social History   Tobacco Use  . Smoking status: Never Smoker  . Smokeless tobacco: Never Used  Substance Use Topics  . Alcohol use: No  . Drug use: Yes    Types: Marijuana    Comment: Daily.      Allergies   Patient has no known allergies.   Review of Systems Review of Systems  Constitutional: Negative for fatigue and fever.  Respiratory: Negative for shortness of breath.   Cardiovascular: Negative for chest pain.  Gastrointestinal: Negative for abdominal pain, nausea and vomiting.  Musculoskeletal: Positive for arthralgias, gait problem, joint swelling and myalgias. Negative for neck pain and neck stiffness.  Skin: Negative for color change and rash.  Neurological: Negative for dizziness,  weakness and light-headedness.     Physical Exam Triage Vital Signs ED Triage Vitals [09/20/17 1424]  Enc Vitals Group     BP (!) 143/78     Pulse Rate 62     Resp 18     Temp 98.3 F (36.8 C)     Temp Source Oral     SpO2 100 %     Weight      Height      Head Circumference      Peak Flow      Pain Score      Pain Loc      Pain Edu?      Excl. in GC?    No data found.  Updated Vital Signs BP (!) 143/78 (BP Location: Left Arm)   Pulse 62   Temp 98.3 F (36.8 C) (Oral)   Resp 18   SpO2 100%   Visual Acuity Right Eye Distance:   Left Eye Distance:   Bilateral Distance:    Right Eye Near:   Left Eye Near:    Bilateral Near:     Physical Exam  Constitutional: He appears well-developed and well-nourished.  HENT:  Head: Normocephalic and atraumatic.  Eyes: Conjunctivae are normal.  Neck: Neck supple.  Cardiovascular: Normal rate.  Pulmonary/Chest: Effort normal. No respiratory distress.  Musculoskeletal: He exhibits no edema.  Tenderness to palpation over suprapatellar area of her tendon, significant pain elicited with extension and movements requiring quadricep activation.  Patient has better ease of movement with flexion.  Negative anterior and posterior drawer, negative Lachman's.  Gait with antalgia.  Neurological: He is alert.  Skin: Skin is warm and dry.  Psychiatric: He has a normal mood and affect.  Nursing note and vitals reviewed.    UC Treatments / Results  Labs (all labs ordered are listed, but only abnormal results are displayed) Labs Reviewed - No data to display  EKG None  Radiology No results found.  Procedures Procedures (including critical care time)  Medications Ordered in UC Medications - No data to display  Initial Impression / Assessment and Plan / UC Course  I have reviewed the triage vital signs and the nursing notes.  Pertinent labs & imaging results that were available during my care of the patient were reviewed by  me and considered in my medical decision making (see chart for details).     Patient with knee pain without injury.  Acute bony abnormality less likely.  Significant tenderness overlying tendon of patellar and quadricep tendon.  Likely strain of this tendon.  At this time will recommend conservative treatment, deferring imaging given lack of injury and nontender over bony aspects of knee.  Knee feels stable.  Will take anti-inflammatories, ice.  Follow-up with orthopedics if symptoms not improving.Discussed strict return precautions. Patient verbalized understanding and is agreeable with plan.  Final Clinical Impressions(s) / UC Diagnoses   Final diagnoses:  Acute pain of left knee     Discharge Instructions     I believe you have strain the tendon connecting your quad to your knee cap  Use anti-inflammatories for pain/swelling. You may take up to 800 mg Ibuprofen every 8 hours with food. You may supplement Ibuprofen with Tylenol [phone] mg every 8 hours.   Ice multiple times a day for 20 minutes   ED Prescriptions    None     Controlled Substance Prescriptions [place] Controlled Substance Registry consulted? Not Applicable   Lew Dawes, [place] 09/20/17 1843

## 2017-09-20 NOTE — Discharge Instructions (Signed)
I believe you have strain the tendon connecting your quad to your knee cap  Use anti-inflammatories for pain/swelling. You may take up to 800 mg Ibuprofen every 8 hours with food. You may supplement Ibuprofen with Tylenol [phone] mg every 8 hours.   Ice multiple times a day for 20 minutes

## 2017-09-20 NOTE — ED Triage Notes (Signed)
Pt sts left knee pain and heel pain after injuring playing basketball

## 2018-03-13 ENCOUNTER — Encounter (HOSPITAL_COMMUNITY): Payer: Self-pay | Admitting: Emergency Medicine

## 2018-03-13 ENCOUNTER — Emergency Department (HOSPITAL_COMMUNITY)
Admission: EM | Admit: 2018-03-13 | Discharge: 2018-03-13 | Disposition: A | Discharge status: Home / Self Care | Payer: Self-pay | Attending: Emergency Medicine | Admitting: Emergency Medicine

## 2018-03-13 DIAGNOSIS — M546 Pain in thoracic spine: Secondary | ICD-10-CM | POA: Insufficient documentation

## 2018-03-13 DIAGNOSIS — J45909 Unspecified asthma, uncomplicated: Secondary | ICD-10-CM | POA: Insufficient documentation

## 2018-03-13 DIAGNOSIS — R350 Frequency of micturition: Secondary | ICD-10-CM | POA: Insufficient documentation

## 2018-03-13 LAB — URINALYSIS, ROUTINE W REFLEX MICROSCOPIC
BILIRUBIN URINE: NEGATIVE
Glucose, UA: NEGATIVE mg/dL
HGB URINE DIPSTICK: NEGATIVE
KETONES UR: NEGATIVE mg/dL
Leukocytes, UA: NEGATIVE
NITRITE: NEGATIVE
PROTEIN: NEGATIVE mg/dL
SPECIFIC GRAVITY, URINE: 1.021 (ref 1.005–1.030)
pH: 7 (ref 5.0–8.0)

## 2018-03-13 LAB — CBG MONITORING, ED: GLUCOSE-CAPILLARY: 106 mg/dL — AB (ref 70–99)

## 2018-03-13 NOTE — Discharge Instructions (Addendum)
Your blood pressure was elevated here Please have this rechecked as an outpatient in the next week and follow-up with a physician

## 2018-03-13 NOTE — ED Triage Notes (Signed)
Pt c/o bilateral flank pain and urinary frequency that started around a week ago. He stated there is no odor associated with the urine but it will either be clear or really cloudy. Pt also c/o urgency associated with the frequency.

## 2018-03-13 NOTE — ED Provider Notes (Signed)
[facility]-EMERGENCY DEPT Provider Note   CSN: [id number] Arrival date & time: 03/13/18  1531     History   Chief Complaint Chief Complaint  Patient presents with  . Urinary Frequency    HPI JUDA TOEPFER is a 22 y.o. male.  HPI 22 year old male presents today complaining of mid back pain with radiation to bilateral flanks intermittently for the past week.  He also states that he has had some increased frequency of urination.  He denies injury, fever, chills, hematuria.  He states his urine is sometimes cloudy. Past Medical History:  Diagnosis Date  . Asthma   . Hypospadias     There are no active problems to display for this patient.   Past Surgical History:  Procedure Laterality Date  . COSMETIC SURGERY    . FINGER SURGERY    . HYPOSPADIAS CORRECTION          Home Medications    Prior to Admission medications   Not on File    Family History History reviewed. No pertinent family history.  Social History Social History   Tobacco Use  . Smoking status: Never Smoker  . Smokeless tobacco: Never Used  Substance Use Topics  . Alcohol use: No  . Drug use: Yes    Types: Marijuana    Comment: Daily.      Allergies   Patient has no known allergies.   Review of Systems Review of Systems  All other systems reviewed and are negative.    Physical Exam Updated Vital Signs BP (!) 152/106 (BP Location: Right Arm)   Pulse 88   Temp 98.6 F (37 C) (Oral)   Resp 18   SpO2 100%   Physical Exam  Constitutional: He is oriented to person, place, and time. He appears well-developed and well-nourished.  HENT:  Head: Normocephalic.  Eyes: Pupils are equal, round, and reactive to light.  Neck: Normal range of motion. Neck supple.  Cardiovascular: Normal rate and regular rhythm.  Pulmonary/Chest: Effort normal.  Abdominal: Soft. Bowel sounds are normal.  Musculoskeletal: Normal range of motion.  Neurological: He is alert and  oriented to person, place, and time.  Skin: Skin is warm and dry. Capillary refill takes less than 2 seconds.  Psychiatric: He has a normal mood and affect.  Nursing note and vitals reviewed.    ED Treatments / Results  Labs (all labs ordered are listed, but only abnormal results are displayed) Labs Reviewed  CBG MONITORING, ED - Abnormal; Notable for the following components:      Result Value   Glucose-Capillary 106 (*)    All other components within normal limits  URINALYSIS, ROUTINE W REFLEX MICROSCOPIC    EKG None  Radiology No results found.  Procedures Procedures (including critical care time)  Medications Ordered in ED Medications - No data to display   Initial Impression / Assessment and Plan / ED Course  I have reviewed the triage vital signs and the nursing notes.  Pertinent labs & imaging results that were available during my care of the patient were reviewed by me and considered in my medical decision making (see chart for details).     Patient has no evidence of urinary tract infection on urinalysis.  Blood pressure is elevated on initial check and recheck remains elevated 140/98.  Plan repeat outpatient check and follow-up.  I discussed the necessity of this with the patient and he voices understanding. Final Clinical Impressions(s) / ED Diagnoses   Final diagnoses:  Midline thoracic back pain, unspecified chronicity  Urinary frequency    ED Discharge Orders    None       Margarita Grizzleay, Cyann Venti, MD 03/13/18 [phone]

## 2018-04-01 ENCOUNTER — Emergency Department (HOSPITAL_COMMUNITY): Payer: Self-pay

## 2018-04-01 ENCOUNTER — Emergency Department (HOSPITAL_COMMUNITY)
Admission: EM | Admit: 2018-04-01 | Discharge: 2018-04-01 | Disposition: A | Discharge status: Home / Self Care | Payer: Self-pay | Attending: Emergency Medicine | Admitting: Emergency Medicine

## 2018-04-01 ENCOUNTER — Other Ambulatory Visit: Payer: Self-pay

## 2018-04-01 DIAGNOSIS — M25532 Pain in left wrist: Secondary | ICD-10-CM | POA: Insufficient documentation

## 2018-04-01 DIAGNOSIS — S63502A Unspecified sprain of left wrist, initial encounter: Secondary | ICD-10-CM

## 2018-04-01 DIAGNOSIS — J45909 Unspecified asthma, uncomplicated: Secondary | ICD-10-CM | POA: Insufficient documentation

## 2018-04-01 MED ORDER — IBUPROFEN 800 MG PO TABS: 800.0000 mg | ORAL_TABLET | Freq: Three times a day (TID) | ORAL | 0 refills | Status: DC | PRN

## 2018-04-01 NOTE — ED Triage Notes (Signed)
Patient c/o left wrist and left thumb pain after trying to catch him self falling while playing basketball.

## 2018-04-01 NOTE — Discharge Instructions (Signed)
It was my pleasure taking care of you today!   Ibuprofen as needed for pain. Apply ice for pain / swelling relief.   If your wrist is not feeling better in the next 1 week, please call the orthopedist listed to schedule a follow-up appointment for reevaluation.  Return to emergency department for any worsening symptoms, any additional concerns.

## 2018-04-01 NOTE — ED Provider Notes (Signed)
[facility]   CSN: [id number] Arrival date & time: 04/01/18  0531     History   Chief Complaint Chief Complaint  Patient presents with  . Wrist Pain    HPI Johnny Garza Millan is a 22 y.o. male.  The history is provided by the patient and medical records. No language interpreter was used.  Wrist Pain   Johnny Garza Garza is an otherwise healthy 22 year old male who presents to the emergency department for left wrist pain after falling on an outstretched hand while playing basketball yesterday.  At initial onset, he felt his hand went numb, but numbness resolved after a few minutes.  No further numbness.  No weakness.  Pain with certain movements.  He is right-hand dominant.  Took Aleve with some improvement.  Denies any open wounds.  No history of previous orthopedic injuries to the wrist.  Past Medical History:  Diagnosis Date  . Asthma   . Hypospadias     There are no active problems to display for this patient.   Past Surgical History:  Procedure Laterality Date  . COSMETIC SURGERY    . FINGER SURGERY    . HYPOSPADIAS CORRECTION          Home Medications    Prior to Admission medications   Medication Sig Start Date End Date Taking? Authorizing Provider  ibuprofen (ADVIL,MOTRIN) 800 MG tablet Take 1 tablet (800 mg total) by mouth every 8 (eight) hours as needed for mild pain or moderate pain. 04/01/18   Vikas Wegmann, Chase PicketJaime Pilcher, PA-C    Family History No family history on file.  Social History Social History   Tobacco Use  . Smoking status: Never Smoker  . Smokeless tobacco: Never Used  Substance Use Topics  . Alcohol use: No  . Drug use: Yes    Types: Marijuana    Comment: Daily.      Allergies   Patient has no known allergies.   Review of Systems Review of Systems  Musculoskeletal: Positive for arthralgias.  Skin: Negative for color change and wound.  Neurological: Negative for weakness and numbness.       Physical Exam Updated Vital Signs BP (!) 134/92   Pulse 65   Temp 98.2 F (36.8 C) (Oral)   Resp 16   Ht 5\' 11"  (1.803 m)   Wt 71.7 kg   SpO2 99%   BMI 22.04 kg/m   Physical Exam Vitals signs and nursing note reviewed.  Constitutional:      General: He is not in acute distress.    Appearance: He is well-developed.  HENT:     Head: Normocephalic and atraumatic.  Neck:     Musculoskeletal: Neck supple.  Cardiovascular:     Rate and Rhythm: Normal rate and regular rhythm.     Heart sounds: Normal heart sounds. No murmur.  Pulmonary:     Effort: Pulmonary effort is normal. No respiratory distress.     Breath sounds: Normal breath sounds. No wheezing or rales.  Musculoskeletal:     Comments: Tenderness to palpation of ulnar aspect of the left wrist.  No anatomical snuffbox tenderness.  2+ radial pulse.  Sensation intact.  Good cap refill.  Full range of motion although pain is reproducible with flexion and extension of the wrist.  Good grip strength.  Skin:    General: Skin is warm and dry.  Neurological:     Mental Status: He is alert.      ED  Treatments / Results  Labs (all labs ordered are listed, but only abnormal results are displayed) Labs Reviewed - No data to display  EKG None  Radiology Dg Wrist Complete Left  Result Date: 04/01/2018 CLINICAL DATA:  Basketball injury EXAM: LEFT WRIST - COMPLETE 3+ VIEW COMPARISON:  None. FINDINGS: There is no evidence of fracture or dislocation. There is no evidence of arthropathy or other focal bone abnormality. Soft tissues are unremarkable. IMPRESSION: Negative. Electronically Signed   By: Deatra RobinsonKevin  Herman M.Garza.   On: 04/01/2018 06:20   Dg Finger Thumb Left  Result Date: 04/01/2018 CLINICAL DATA:  Acute onset of left thumb pain after injury while playing basketball. Initial encounter. EXAM: LEFT THUMB 2+V COMPARISON:  None. FINDINGS: There is no evidence of fracture or dislocation. The thumb appears grossly intact.  Visualized joint spaces are preserved. No definite soft tissue abnormalities are characterized on radiograph. IMPRESSION: No evidence of fracture or dislocation. Electronically Signed   By: Roanna RaiderJeffery  Chang M.Garza.   On: 04/01/2018 06:20    Procedures Procedures (including critical care time)  Medications Ordered in ED Medications - No data to display   Initial Impression / Assessment and Plan / ED Course  I have reviewed the triage vital signs and the nursing notes.  Pertinent labs & imaging results that were available during my care of the patient were reviewed by me and considered in my medical decision making (see chart for details).    Johnny Garza Lad is a 22 y.o. male who presents to ED for wrist pain since yesterday after falling on an outstretched hand.  X-ray negative.  Neurovascularly intact.  No anatomical snuffbox tenderness.  Provided with splint.  Discussed rice and NSAID therapy.  Ortho follow-up if no improvement.  Reasons to return to ER discussed and all questions answered.   Final Clinical Impressions(s) / ED Diagnoses   Final diagnoses:  Sprain of left wrist, initial encounter    ED Discharge Orders         Ordered    ibuprofen (ADVIL,MOTRIN) 800 MG tablet  Every 8 hours PRN     04/01/18 0729           Mylynn Dinh, Chase PicketJaime Pilcher, PA-C 04/01/18 0827    Lorre NickAllen, Anthony, MD 04/02/18 [phone]

## 2018-10-03 ENCOUNTER — Ambulatory Visit: Payer: Self-pay | Admitting: Nurse Practitioner

## 2018-11-25 ENCOUNTER — Encounter (HOSPITAL_COMMUNITY): Payer: Self-pay

## 2018-11-25 ENCOUNTER — Other Ambulatory Visit: Payer: Self-pay

## 2018-11-25 ENCOUNTER — Emergency Department (HOSPITAL_COMMUNITY)
Admission: EM | Admit: 2018-11-25 | Discharge: 2018-11-25 | Disposition: A | Discharge status: Home / Self Care | Payer: Self-pay | Attending: Emergency Medicine | Admitting: Emergency Medicine

## 2018-11-25 DIAGNOSIS — F1210 Cannabis abuse, uncomplicated: Secondary | ICD-10-CM | POA: Insufficient documentation

## 2018-11-25 DIAGNOSIS — J45909 Unspecified asthma, uncomplicated: Secondary | ICD-10-CM | POA: Insufficient documentation

## 2018-11-25 DIAGNOSIS — K146 Glossodynia: Secondary | ICD-10-CM | POA: Insufficient documentation

## 2018-11-25 DIAGNOSIS — R369 Urethral discharge, unspecified: Secondary | ICD-10-CM | POA: Insufficient documentation

## 2018-11-25 LAB — URINALYSIS, ROUTINE W REFLEX MICROSCOPIC
Bilirubin Urine: NEGATIVE
Glucose, UA: NEGATIVE mg/dL
Hgb urine dipstick: NEGATIVE
Ketones, ur: NEGATIVE mg/dL
Leukocytes,Ua: NEGATIVE
Nitrite: NEGATIVE
Protein, ur: NEGATIVE mg/dL
Specific Gravity, Urine: 1.028 (ref 1.005–1.030)
pH: 6 (ref 5.0–8.0)

## 2018-11-25 NOTE — Discharge Instructions (Addendum)
Follow-up with your primary care provider in 2 to 3 days for continued evaluation.  Return to the ED immediately for new or worsening symptoms or concerns, such as difficulty swallowing or breathing or any concerns at all.

## 2018-11-25 NOTE — ED Triage Notes (Signed)
States since he had sex 2 days ago tongue feels weird, and for awhile has had clear penile discharge no dysuria or pain voiced.

## 2018-11-25 NOTE — ED Provider Notes (Signed)
[facility] DEPT Provider Note   CSN: [id number] Arrival date & time: 11/25/18  1515     History   Chief Complaint Chief Complaint  Patient presents with  . possible std    HPI Johnny Garza is a 23 y.o. male.     HPI   23 year old male presents with 2 complaints.  He states that after he gave oral sex 2 days ago he has had pain to the frenulum.  He denies any difficulty eating, moving his tongue.  He denies any ice or tingling of the tongue.  He states also he has had clear discharge from his penis intermittently for the last month.  He denies any known exposure to STDs but does note he is with a new partner.  He denies any dysuria, urinary urgency, urinary frequency, hematuria.  She denies any abdominal pain, fevers, chills.  Within the last month patient has been evaluated for his penile discharge and been treated for STDs.   Past Medical History:  Diagnosis Date  . Asthma   . Hypospadias     There are no active problems to display for this patient.   Past Surgical History:  Procedure Laterality Date  . COSMETIC SURGERY    . FINGER SURGERY    . HYPOSPADIAS CORRECTION          Home Medications    Prior to Admission medications   Medication Sig Start Date End Date Taking? Authorizing Provider  ibuprofen (ADVIL,MOTRIN) 800 MG tablet Take 1 tablet (800 mg total) by mouth every 8 (eight) hours as needed for mild pain or moderate pain. 04/01/18   Ward, Ozella Almond, PA-C    Family History History reviewed. No pertinent family history.  Social History Social History   Tobacco Use  . Smoking status: Never Smoker  . Smokeless tobacco: Never Used  Substance Use Topics  . Alcohol use: No  . Drug use: Yes    Types: Marijuana    Comment: Daily.      Allergies   Patient has no known allergies.   Review of Systems Review of Systems  Constitutional: Negative for chills and fever.  HENT: Negative for facial swelling,  rhinorrhea and sore throat.        Pain of the frenulum  Respiratory: Negative for shortness of breath.   Cardiovascular: Negative for chest pain.  Gastrointestinal: Negative for abdominal pain, nausea and vomiting.  Genitourinary: Positive for discharge. Negative for frequency, penile pain, penile swelling, scrotal swelling, testicular pain and urgency.     Physical Exam Updated Vital Signs BP 131/90   Pulse (!) 106   Temp 98.8 F (37.1 C) (Oral)   Resp 16   Ht 5\' 11"  (1.803 m)   Wt 71.7 kg   SpO2 99%   BMI 22.04 kg/m   Physical Exam Vitals signs and nursing note reviewed. Exam conducted with a chaperone present.  Constitutional:      Appearance: He is well-developed.  HENT:     Head: Normocephalic and atraumatic.  Eyes:     Conjunctiva/sclera: Conjunctivae normal.  Neck:     Musculoskeletal: Neck supple.  Cardiovascular:     Rate and Rhythm: Regular rhythm. Tachycardia present.     Heart sounds: Normal heart sounds. No murmur.  Pulmonary:     Effort: Pulmonary effort is normal. No respiratory distress.     Breath sounds: Normal breath sounds. No wheezing or rales.  Abdominal:     General: Bowel sounds are normal. There  is no distension.     Palpations: Abdomen is soft.     Tenderness: There is no abdominal tenderness.  Genitourinary:    Pubic Area: No rash.      Penis: Normal. No phimosis, paraphimosis, tenderness or discharge.      Scrotum/Testes: Normal.        Right: Tenderness or swelling not present.        Left: Tenderness or swelling not present.  Musculoskeletal: Normal range of motion.        General: No tenderness or deformity.  Skin:    General: Skin is warm and dry.     Findings: No erythema or rash.  Neurological:     Mental Status: He is alert and oriented to person, place, and time.  Psychiatric:        Behavior: Behavior normal.      ED Treatments / Results  Labs (all labs ordered are listed, but only abnormal results are displayed)  Labs Reviewed  URINALYSIS, ROUTINE W REFLEX MICROSCOPIC  GC/CHLAMYDIA PROBE AMP ([place]    EKG None  Radiology No results found.  Procedures Procedures (including critical care time)  Medications Ordered in ED Medications - No data to display   Initial Impression / Assessment and Plan / ED Course  I have reviewed the triage vital signs and the nursing notes.  Pertinent labs & imaging results that were available during my care of the patient were reviewed by me and considered in my medical decision making (see chart for details).        Patient presents with penile discharge and pain of the frenulum of the tongue.  His physical exam is unremarkable, no evidence of injury or trauma to the tongue, full range of motion of the tongue.  No tears noted of the frenulum of the tongue.  No penile discharge, no swelling of the testicles.  Urine with no evidence of UTI.  GC on the urine pending.  Encouraged follow-up with primary care.  Given return precautions.  He is ready and stable for discharge.    Final Clinical Impressions(s) / ED Diagnoses   Final diagnoses:  None    ED Discharge Orders    None       Rueben BashKendrick, Aneesha Holloran S, PA-C 11/25/18 1721    Milagros Lollykstra, Richard S, MD 11/26/18 [phone]

## 2019-04-02 ENCOUNTER — Other Ambulatory Visit: Payer: Self-pay

## 2019-04-02 DIAGNOSIS — Q386 Other congenital malformations of mouth: Secondary | ICD-10-CM | POA: Insufficient documentation

## 2019-04-02 DIAGNOSIS — J45909 Unspecified asthma, uncomplicated: Secondary | ICD-10-CM | POA: Insufficient documentation

## 2019-04-02 DIAGNOSIS — R369 Urethral discharge, unspecified: Secondary | ICD-10-CM | POA: Insufficient documentation

## 2019-04-03 ENCOUNTER — Encounter (HOSPITAL_COMMUNITY): Payer: Self-pay

## 2019-04-03 ENCOUNTER — Other Ambulatory Visit: Payer: Self-pay

## 2019-04-03 ENCOUNTER — Emergency Department (HOSPITAL_COMMUNITY)
Admission: EM | Admit: 2019-04-03 | Discharge: 2019-04-03 | Disposition: A | Discharge status: Home / Self Care | Payer: Self-pay | Attending: Emergency Medicine | Admitting: Emergency Medicine

## 2019-04-03 DIAGNOSIS — Q386 Other congenital malformations of mouth: Secondary | ICD-10-CM

## 2019-04-03 DIAGNOSIS — R369 Urethral discharge, unspecified: Secondary | ICD-10-CM

## 2019-04-03 LAB — URINALYSIS, ROUTINE W REFLEX MICROSCOPIC
Bilirubin Urine: NEGATIVE
Glucose, UA: NEGATIVE mg/dL
Hgb urine dipstick: NEGATIVE
Ketones, ur: NEGATIVE mg/dL
Nitrite: NEGATIVE
Protein, ur: NEGATIVE mg/dL
Specific Gravity, Urine: 1.027 (ref 1.005–1.030)
pH: 6 (ref 5.0–8.0)

## 2019-04-03 LAB — RPR: RPR Ser Ql: NONREACTIVE

## 2019-04-03 LAB — RAPID HIV SCREEN (HIV 1/2 AB+AG)
HIV 1/2 Antibodies: NONREACTIVE
HIV-1 P24 Antigen - HIV24: NONREACTIVE

## 2019-04-03 NOTE — ED Notes (Signed)
Assisted as a chaperoned for Mia, PA as she examined pts rash on genitals.

## 2019-04-03 NOTE — ED Provider Notes (Signed)
[facility]-EMERGENCY DEPT Provider Note   CSN: [id number] Arrival date & time: 04/02/19  2302     History Chief Complaint  Patient presents with  . SEXUALLY TRANSMITTED DISEASE    Priscille KluverMason D Crownover is a 23 y.o. male who presents to the emergency department with a chief complaint of rash.  The patient reports that he noticed a painless rash on his penis yesterday.  The rash is not pruritic.  Reports that he had a similar rash about 2 years ago that resolved on its own.  He also reports that he has noticed intermittent clear/white discharge from his penis.  He only seems to notice it when he sits down to urinate.  He denies dysuria, urinary frequency, hesitancy, hematuria, fever, chills, abdominal pain, nausea, vomiting, diarrhea, constipation, testicular pain or swelling.  He reports that he is sexually active with 1 male partner.  He last had intercourse approximately 2 weeks ago.  He does note that he had STI testing since he began having records with his current partner and testing was negative.  He reports that when they last had intercourse that he used a condom.   The history is provided by the patient. No language interpreter was used.       Past Medical History:  Diagnosis Date  . Asthma   . Hypospadias     There are no problems to display for this patient.   Past Surgical History:  Procedure Laterality Date  . COSMETIC SURGERY    . FINGER SURGERY    . HYPOSPADIAS CORRECTION         No family history on file.  Social History   Tobacco Use  . Smoking status: Never Smoker  . Smokeless tobacco: Never Used  Substance Use Topics  . Alcohol use: No  . Drug use: Yes    Types: Marijuana    Comment: Daily.     Home Medications Prior to Admission medications   Medication Sig Start Date End Date Taking? Authorizing Provider  ibuprofen (ADVIL,MOTRIN) 800 MG tablet Take 1 tablet (800 mg total) by mouth every 8 (eight) hours as needed for mild  pain or moderate pain. 04/01/18   Ward, Chase PicketJaime Pilcher, PA-C    Allergies    Patient has no known allergies.  Review of Systems   Review of Systems  Constitutional: Negative for appetite change and fever.  Respiratory: Negative for shortness of breath.   Cardiovascular: Negative for chest pain.  Gastrointestinal: Negative for abdominal pain, blood in stool, diarrhea, nausea and vomiting.  Genitourinary: Positive for discharge. Negative for dysuria, flank pain, frequency, genital sores, penile pain, penile swelling, scrotal swelling, testicular pain and urgency.  Musculoskeletal: Negative for back pain.  Skin: Negative for rash.  Allergic/Immunologic: Negative for immunocompromised state.  Neurological: Negative for seizures, syncope, weakness, numbness and headaches.  Psychiatric/Behavioral: Negative for confusion.    Physical Exam Updated Vital Signs BP 137/77 (BP Location: Left Arm)   Pulse 87   Temp 98.1 F (36.7 C) (Oral)   Resp 16   Ht 5\' 11"  (1.803 m)   Wt 70.3 kg   SpO2 99%   BMI 21.62 kg/m   Physical Exam Vitals and nursing note reviewed.  Constitutional:      General: He is not in acute distress.    Appearance: He is well-developed. He is not ill-appearing, toxic-appearing or diaphoretic.  HENT:     Head: Normocephalic.  Eyes:     Conjunctiva/sclera: Conjunctivae normal.  Cardiovascular:  Rate and Rhythm: Normal rate and regular rhythm.     Heart sounds: No murmur.  Pulmonary:     Effort: Pulmonary effort is normal.  Abdominal:     General: There is no distension.     Palpations: Abdomen is soft. There is no mass.     Tenderness: There is no abdominal tenderness. There is no right CVA tenderness, left CVA tenderness, guarding or rebound.     Hernia: No hernia is present.  Genitourinary:    Comments: Chaperoned exam with RN. No evidence of any lesions or rashes, including vesicles.  No evidence of a chancre.  No pearly white papules.  There are possible  slight fordyce spots on the right shaft of his penis.  No evidence of phimosis or paraphimosis.  No vesicles.  No penile or testicular tenderness to palpation.  No penile discharge.  No testicular pain or swelling. Musculoskeletal:     Cervical back: Neck supple.  Skin:    General: Skin is warm and dry.  Neurological:     Mental Status: He is alert.  Psychiatric:        Behavior: Behavior normal.     ED Results / Procedures / Treatments   Labs (all labs ordered are listed, but only abnormal results are displayed) Labs Reviewed  URINALYSIS, ROUTINE W REFLEX MICROSCOPIC - Abnormal; Notable for the following components:      Result Value   Leukocytes,Ua MODERATE (*)    Bacteria, UA RARE (*)    All other components within normal limits  RAPID HIV SCREEN (HIV 1/2 AB+AG)  RPR  GC/CHLAMYDIA PROBE AMP ([place]) NOT AT [facility]    EKG None  Radiology No results found.  Procedures Procedures (including critical care time)  Medications Ordered in ED Medications - No data to display  ED Course  I have reviewed the triage vital signs and the nursing notes.  Pertinent labs & imaging results that were available during my care of the patient were reviewed by me and considered in my medical decision making (see chart for details).    MDM Rules/Calculators/A&P                      23 year old male presenting with intermittent white/clear vaginal discharge and concern for a rash on his penis.  Initially he states that he has never had a similar rash, but then reports that he thinks he was seen for the same in the ER 2 years ago where he was seen and thought to have Fordyce spots.  On a chaperoned exam, I do not see any penile discharge.  No vesicles or lesions concerning for chancre.  There could be a couple of Fordyce spots on the shaft of the penis, but exam is otherwise unremarkable.  His other concern was regarding some intermittent discharge.  UA was obtained and did show a small  amount of leukocytes.  The patient is adamant that he has only had one male partner and they always use condoms.  He is very low risk at this time.  We had a shared decision-making conversation regarding empiric treatment versus waiting for his results.  He would prefer to wait for results and is aware that if they are positive that he will receive a call from the hospital.  He will need to let all sexual partners know if you test positive.  He is aware that he has gonorrhea, chlamydia, syphilis, and HIV testing pending.  He was given strict return  precautions to the ER.  He was given a referral to the [facility] department for routine STD testing.  He is hemodynamically stable and in no acute distress.  Safe for discharge to home with outpatient follow-up as needed.  Final Clinical Impression(s) / ED Diagnoses Final diagnoses:  Fordyce spots  Penile discharge    Rx / DC Orders ED Discharge Orders    None       Barkley Boards, PA-C 04/03/19 0709    Marily Memos, MD 04/03/19 2253

## 2019-04-03 NOTE — ED Triage Notes (Signed)
Pt coming in c/o clear/white discharge from penis. Also c/o rash on penis. No burning with urination, no new partners. Last intercourse 2 weeks ago.

## 2019-04-03 NOTE — ED Notes (Signed)
Pt verbalized discharge instructions and follow up care. Alert and ambulatory. No IV.  

## 2019-04-03 NOTE — Discharge Instructions (Addendum)
Thank you for allowing me to care for you today in the Emergency Department.   You were seen today for rash on your penis and for discharge from your penis.  The rash is not concerning for genital herpes.  I suspect this is Fordyce spots, which you have had previously.  Regarding the penile discharge, you declined treatment today and decided to wait until the test results were available.  You were tested for gonorrhea chlamydia, syphilis, and HIV.  If any of these tests are positive, you should receive a call from the hospital.  You can also check the results in my chart.  Instructions on downloading this app are available along with your discharge paperwork.  If any of the tests are positive, you need to return for treatment and with all sexual partners know that you had a positive test that they can seek treatment.  If the rash does not improve, you should call to get established with a primary care provider.  Return to the emergency department if you develop high fevers, severe pain in your testicles, uncontrollable vomiting, severe abdominal pain, or other new, concerning symptoms.

## 2019-05-17 ENCOUNTER — Ambulatory Visit
Admission: RE | Admit: 2019-05-17 | Discharge: 2019-05-17 | Disposition: A | Discharge status: Home / Self Care | Payer: 59 | Source: Ambulatory Visit | Attending: Family Medicine | Admitting: Family Medicine

## 2019-05-17 ENCOUNTER — Other Ambulatory Visit: Payer: Self-pay | Admitting: Family Medicine

## 2019-05-17 DIAGNOSIS — R0789 Other chest pain: Secondary | ICD-10-CM

## 2019-08-06 ENCOUNTER — Encounter (HOSPITAL_COMMUNITY): Payer: Self-pay

## 2019-08-06 ENCOUNTER — Ambulatory Visit (HOSPITAL_COMMUNITY)
Admission: EM | Admit: 2019-08-06 | Discharge: 2019-08-06 | Disposition: A | Discharge status: Home / Self Care | Payer: 59 | Attending: Nurse Practitioner | Admitting: Nurse Practitioner

## 2019-08-06 DIAGNOSIS — H6691 Otitis media, unspecified, right ear: Secondary | ICD-10-CM

## 2019-08-06 MED ORDER — AMOXICILLIN-POT CLAVULANATE 875-125 MG PO TABS: 1.0000 | ORAL_TABLET | Freq: Two times a day (BID) | ORAL | 0 refills | Status: DC

## 2019-08-06 NOTE — ED Provider Notes (Signed)
[place]    CSN: [id number] Arrival date & time: 08/06/19  0803      History   Chief Complaint Chief Complaint  Patient presents with  . Otalgia    HPI Johnny Garza is a 24 y.o. male.   Subjective:   Johnny Garza is a 24 y.o. male who presents for possible ear infection. Symptoms include right ear pain and cough. Onset of symptoms was 1 day ago and unchanged since that time. Associated symptoms include right sided dental pain. He denies any fevers, sweats, chills, achiness, congestion, facial pain, lightheadedness, purulent nasal discharge, shortness of breath, sinus pressure, sneezing or change in taste/smell. He is drinking plenty of fluids. He hasn't tried anything for his symptoms.   The following portions of the patient's history were reviewed and updated as appropriate: allergies, current medications, past family history, past medical history, past social history, past surgical history and problem list.       Past Medical History:  Diagnosis Date  . Asthma   . Hypospadias     There are no problems to display for this patient.   Past Surgical History:  Procedure Laterality Date  . COSMETIC SURGERY    . FINGER SURGERY    . HYPOSPADIAS CORRECTION         Home Medications    Prior to Admission medications   Medication Sig Start Date End Date Taking? Authorizing Provider  amoxicillin-clavulanate (AUGMENTIN) 875-125 MG tablet Take 1 tablet by mouth every 12 (twelve) hours. 08/06/19   Enrique Sack, FNP    Family History Family History  Problem Relation Age of Onset  . Healthy Mother   . Healthy Father     Social History Social History   Tobacco Use  . Smoking status: Never Smoker  . Smokeless tobacco: Never Used  Substance Use Topics  . Alcohol use: No  . Drug use: Yes    Types: Marijuana    Comment: Daily.      Allergies   Patient has no known allergies.   Review of Systems Review of Systems  Constitutional:  Negative.   HENT: Positive for dental problem and ear pain.   Respiratory: Negative.   Gastrointestinal: Negative.   Neurological: Negative.   All other systems reviewed and are negative.    Physical Exam Triage Vital Signs ED Triage Vitals  Enc Vitals Group     BP --      Pulse --      Resp --      Temp --      Temp src --      SpO2 --      Weight 08/06/19 0830 158 lb (71.7 kg)     Height 08/06/19 0830 5\' 11"  (1.803 m)     Head Circumference --      Peak Flow --      Pain Score 08/06/19 0829 7     Pain Loc --      Pain Edu? --      Excl. in [place]? --    No data found.  Updated Vital Signs Ht 5\' 11"  (1.803 m)   Wt 158 lb (71.7 kg)   BMI 22.04 kg/m   Visual Acuity Right Eye Distance:   Left Eye Distance:   Bilateral Distance:    Right Eye Near:   Left Eye Near:    Bilateral Near:     Physical Exam Vitals reviewed.  Constitutional:      General: He is not  in acute distress.    Appearance: Normal appearance. He is diaphoretic. He is not ill-appearing or toxic-appearing.  HENT:     Head: Normocephalic.     Right Ear: Hearing normal. Swelling and tenderness present. Tympanic membrane is injected and erythematous.     Left Ear: Tympanic membrane, ear canal and external ear normal.     Nose: Nose normal.     Mouth/Throat:     Lips: Pink.     Mouth: Mucous membranes are moist.     Dentition: No dental caries.     Tongue: No lesions.     Pharynx: Oropharynx is clear. Uvula midline.  Eyes:     Extraocular Movements: Extraocular movements intact.     Conjunctiva/sclera: Conjunctivae normal.     Pupils: Pupils are equal, round, and reactive to light.  Cardiovascular:     Rate and Rhythm: Normal rate and regular rhythm.  Pulmonary:     Effort: Pulmonary effort is normal.     Breath sounds: Normal breath sounds.  Musculoskeletal:        General: Normal range of motion.     Cervical back: Normal range of motion and neck supple. No tenderness.  Lymphadenopathy:      Cervical: No cervical adenopathy.  Skin:    General: Skin is warm.  Neurological:     General: No focal deficit present.     Mental Status: He is alert and oriented to person, place, and time.  Psychiatric:        Mood and Affect: Mood normal.        Behavior: Behavior normal.      UC Treatments / Results  Labs (all labs ordered are listed, but only abnormal results are displayed) Labs Reviewed - No data to display  EKG   Radiology No results found.  Procedures Procedures (including critical care time)  Medications Ordered in UC Medications - No data to display  Initial Impression / Assessment and Plan / UC Course  I have reviewed the triage vital signs and the nursing notes.  Pertinent labs & imaging results that were available during my care of the patient were reviewed by me and considered in my medical decision making (see chart for details).    24 yo male with right OM.    Plan:  1.  Treatment:  Augmentin 2.  OTC analgesics, fluids, rest.  3.  Follow up with PCP in 5 days if not improving.  Today's evaluation has revealed no signs of a dangerous process. Discussed diagnosis with patient and/or guardian. Patient and/or guardian aware of their diagnosis, possible red flag symptoms to watch out for and need for close follow up. Patient and/or guardian understands verbal and written discharge instructions. Patient and/or guardian comfortable with plan and disposition.  Patient and/or guardian has a clear mental status at this time, good insight into illness (after discussion and teaching) and has clear judgment to make decisions regarding their care  This care was provided during an unprecedented National Emergency due to the Novel Coronavirus (COVID-19) pandemic. COVID-19 infections and transmission risks place heavy strains on healthcare resources.  As this pandemic evolves, our facility, providers, and staff strive to respond fluidly, to remain operational, and  to provide care relative to available resources and information. Outcomes are unpredictable and treatments are without well-defined guidelines. Further, the impact of COVID-19 on all aspects of urgent care, including the impact to patients seeking care for reasons other than COVID-19, is unavoidable during this national emergency. At  this time of the global pandemic, management of patients has significantly changed, even for non-COVID positive patients given high local and regional COVID volumes at this time requiring high healthcare system and resource utilization. The standard of care for management of both COVID suspected and non-COVID suspected patients continues to change rapidly at the local, regional, national, and global levels. This patient was worked up and treated to the best available but ever changing evidence and resources available at this current time.   Documentation was completed with the aid of voice recognition software. Transcription may contain typographical errors.  Final Clinical Impressions(s) / UC Diagnoses   Final diagnoses:  Right otitis media, unspecified otitis media type     Discharge Instructions     Take medications as prescribed. You may take tylenol or ibuprofen as needed for pain.   Feel better soon!  Lelon Mast, FNP-C     ED Prescriptions    Medication Sig Dispense Auth. Provider   amoxicillin-clavulanate (AUGMENTIN) 875-125 MG tablet Take 1 tablet by mouth every 12 (twelve) hours. 14 tablet Lurline Idol, FNP     PDMP not reviewed this encounter.   Cathlean Sauer [place], [place] 08/06/19 [phone]

## 2019-08-06 NOTE — Discharge Instructions (Signed)
Take medications as prescribed. You may take tylenol or ibuprofen as needed for pain.   Feel better soon!  Lelon Mast, FNP-C

## 2019-08-06 NOTE — ED Triage Notes (Signed)
Pt c/o 7/10 throbbing pain in right ear since last night. Pt denies any other symptoms.

## 2019-12-24 ENCOUNTER — Other Ambulatory Visit: Payer: Self-pay

## 2019-12-24 ENCOUNTER — Emergency Department (HOSPITAL_COMMUNITY)
Admission: EM | Admit: 2019-12-24 | Discharge: 2019-12-24 | Disposition: A | Discharge status: Home / Self Care | Payer: 59 | Attending: Emergency Medicine | Admitting: Emergency Medicine

## 2019-12-24 DIAGNOSIS — J45909 Unspecified asthma, uncomplicated: Secondary | ICD-10-CM | POA: Diagnosis not present

## 2019-12-24 DIAGNOSIS — R238 Other skin changes: Secondary | ICD-10-CM | POA: Diagnosis not present

## 2019-12-24 DIAGNOSIS — L989 Disorder of the skin and subcutaneous tissue, unspecified: Secondary | ICD-10-CM | POA: Diagnosis present

## 2019-12-24 NOTE — ED Provider Notes (Signed)
[facility]-EMERGENCY DEPT [facility] Emergency Department Provider Note MRN:  [id number]  Arrival date & time: 12/24/19     Chief Complaint   Skin lesion History of Present Illness   Johnny Garza is a 24 y.o. year-old male with no pertinent past medical history presenting to the ED with chief complaint of skin lesion.  Patient noticed a bump on his penis today and is here for evaluation.  The bump does not hurt, there is only 1 small lesion on the shaft of his penis.  Denies any fever, no pain, the bump is not painful or tender or itchy.  There is no penile discharge, no burning with urination, no testicular pain, no other complaints.  Patient is here because he is worried about what it could be.  Review of Systems  A problem-focused ROS was performed. Positive for penile lesion.  Patient denies pain, fever, dysuria.  Patient's Health History    Past Medical History:  Diagnosis Date  . Asthma   . Hypospadias     Past Surgical History:  Procedure Laterality Date  . COSMETIC SURGERY    . FINGER SURGERY    . HYPOSPADIAS CORRECTION      Family History  Problem Relation Age of Onset  . Healthy Mother   . Healthy Father     Social History   Socioeconomic History  . Marital status: Single    Spouse name: Not on file  . Number of children: Not on file  . Years of education: Not on file  . Highest education level: Not on file  Occupational History  . Not on file  Tobacco Use  . Smoking status: Never Smoker  . Smokeless tobacco: Never Used  Vaping Use  . Vaping Use: Never used  Substance and Sexual Activity  . Alcohol use: No  . Drug use: Yes    Types: Marijuana    Comment: Daily.   Marland Kitchen Sexual activity: Yes    Birth control/protection: Condom  Other Topics Concern  . Not on file  Social History Narrative  . Not on file   Social Determinants of Health   Financial Resource Strain:   . Difficulty of Paying Living Expenses: Not on file  Food Insecurity:   .  Worried About Programme researcher, broadcasting/film/video in the Last Year: Not on file  . Ran Out of Food in the Last Year: Not on file  Transportation Needs:   . Lack of Transportation (Medical): Not on file  . Lack of Transportation (Non-Medical): Not on file  Physical Activity:   . Days of Exercise per Week: Not on file  . Minutes of Exercise per Session: Not on file  Stress:   . Feeling of Stress : Not on file  Social Connections:   . Frequency of Communication with Friends and Family: Not on file  . Frequency of Social Gatherings with Friends and Family: Not on file  . Attends Religious Services: Not on file  . Active Member of Clubs or Organizations: Not on file  . Attends Banker Meetings: Not on file  . Marital Status: Not on file  Intimate Partner Violence:   . Fear of Current or Ex-Partner: Not on file  . Emotionally Abused: Not on file  . Physically Abused: Not on file  . Sexually Abused: Not on file     Physical Exam   Vitals:   12/24/19 1134  BP: (!) 138/99  Pulse: 95  Resp: 16  Temp: 98.2 F (36.8 C)  SpO2: 100%    CONSTITUTIONAL: Well-appearing, NAD NEURO:  Alert and oriented x 3, no focal deficits EYES:  eyes equal and reactive ENT/NECK:  no LAD, no JVD CARDIO: Regular rate, well-perfused, normal S1 and S2 PULM:  CTAB no wheezing or rhonchi GI/GU:  normal bowel sounds, non-distended, non-tender; normal appearing external genitalia, no lymphadenopathy, no penile or scrotal tenderness, tiny mildly erythematous papule to the dorsal aspect of the shaft of the penis MSK/SPINE:  No gross deformities, no edema SKIN:  no rash, atraumatic PSYCH:  Appropriate speech and behavior  *Additional and/or pertinent findings included in MDM below  Diagnostic and Interventional Summary    EKG Interpretation  Date/Time:    Ventricular Rate:    PR Interval:    QRS Duration:   QT Interval:    QTC Calculation:   R Axis:     Text Interpretation:        Labs Reviewed - No  data to display  No orders to display    Medications - No data to display   Procedures  /  Critical Care Procedures  ED Course and Medical Decision Making  I have reviewed the triage vital signs, the nursing notes, and pertinent available records from the EMR.  Listed above are laboratory and imaging tests that I personally ordered, reviewed, and interpreted and then considered in my medical decision making (see below for details).  Favoring inflamed hair follicle, discussed other possibilities with patient.  Could be a small genital wart due to HPV, could be a single herpes lesion.  Patient is in a monogamous relationship, denies risky sexual behavior.  Otherwise a reassuring exam, he has been recently tested for STDs and there is no indication for treatment at this time.  Provided reassurance, advised follow-up if symptoms worsen.       Elmer Sow. Pilar Plate, MD [facility] Emergency Medicine [facility] [email or web address]  Final Clinical Impressions(s) / ED Diagnoses     ICD-10-CM   1. Papule of skin  R23.8     ED Discharge Orders    None       Discharge Instructions Discussed with and Provided to Patient:     Discharge Instructions     You were evaluated in the Emergency Department and after careful evaluation, we did not find any emergent condition requiring admission or further testing in the hospital.  Your exam/testing today was overall reassuring.  The bump is likely due to an inflamed hair follicle.  This will likely go away on its own.  We discussed the other possibilities and if these bumps spread or worsen it would be important to follow-up with a primary care doctor.  Please return to the Emergency Department if you experience any worsening of your condition.  Thank you for allowing [place] to be a part of your care.       Sabas Sous, MD 12/24/19 1218

## 2019-12-24 NOTE — Discharge Instructions (Addendum)
You were evaluated in the Emergency Department and after careful evaluation, we did not find any emergent condition requiring admission or further testing in the hospital.  Your exam/testing today was overall reassuring.  The bump is likely due to an inflamed hair follicle.  This will likely go away on its own.  We discussed the other possibilities and if these bumps spread or worsen it would be important to follow-up with a primary care doctor.  Please return to the Emergency Department if you experience any worsening of your condition.  Thank you for allowing [place] to be a part of your care.

## 2019-12-24 NOTE — ED Notes (Signed)
Patient c/o bump on his penis. Pt no complain of dysuria , no drainiage and non itching.

## 2020-08-12 ENCOUNTER — Ambulatory Visit (HOSPITAL_COMMUNITY)
Admission: EM | Admit: 2020-08-12 | Discharge: 2020-08-12 | Disposition: A | Discharge status: Home / Self Care | Payer: 59 | Attending: Family Medicine | Admitting: Family Medicine

## 2020-08-12 ENCOUNTER — Encounter (HOSPITAL_COMMUNITY): Payer: Self-pay | Admitting: *Deleted

## 2020-08-12 ENCOUNTER — Other Ambulatory Visit: Payer: Self-pay

## 2020-08-12 ENCOUNTER — Ambulatory Visit (INDEPENDENT_AMBULATORY_CARE_PROVIDER_SITE_OTHER): Payer: 59

## 2020-08-12 DIAGNOSIS — R079 Chest pain, unspecified: Secondary | ICD-10-CM | POA: Diagnosis not present

## 2020-08-12 NOTE — ED Triage Notes (Signed)
Pt reports Rt side started to hurt today. 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 Pt raises arm above his head and reports his side hurts worse then.

## 2020-08-12 NOTE — Discharge Instructions (Addendum)
You may take over the counter ibuprofen 800mg  by mouth 3x/day for the next several days.  Your chest x-ray did not show any abnormalities.

## 2020-08-13 NOTE — ED Provider Notes (Signed)
[facility]   [id number] 08/12/20 Arrival Time: 1925  ASSESSMENT & PLAN:  1. Chest pain, unspecified type    I have personally viewed the imaging studies ordered this visit. No pneumothorax.  Discussed likely MSK etiology.   Discharge Instructions     You may take over the counter ibuprofen 800mg  by mouth 3x/day for the next several days.  Your chest x-ray did not show any abnormalities.     Follow-up Information    , MD.   Specialty: Family Medicine Why: As needed. Contact information: [address] [place] [phone]        [facility].   Specialty: Urgent Care Why: If worsening or failing to improve as anticipated. Contact information: [address] [place] [phone]              Reviewed expectations re: course of current medical issues. Questions answered. Outlined signs and symptoms indicating need for more acute intervention. Patient verbalized understanding. After Visit Summary given.   SUBJECTIVE:  History from: patient. Johnny Garza is a 25 y.o. male who presents with complaint of persistent RIGHT sided chest pain. Abrupt onset; today. No trauma. Pain with deep breath but without SOB reported. No recent illnesses. Afebrile. Pain worsened when lifting up right arm. Lifts a lot at work; questions relation. No tx PTA. Smokes THC. Denies cocaine/crack use.  Social History   Tobacco Use  Smoking Status Never Smoker  Smokeless Tobacco Never Used   Social History   Substance and Sexual Activity  Alcohol Use No    OBJECTIVE:  Vitals:   08/12/20 1933  BP: 121/76  Pulse: 71  Resp: 16  Temp: 99.4 F (37.4 C)  TempSrc: Oral  SpO2: 99%    General appearance: alert, oriented, no acute distress Eyes: PERRLA; EOMI; conjunctivae normal HENT: normocephalic; atraumatic Neck: supple with FROM Lungs: without labored respirations; speaks  full sentences without difficulty; CTAB Heart: regular rate and rhythm without murmer Chest Wall: with tenderness to palpation over upper chest wall on midaxillary line Abdomen: soft, non-tender; no guarding or rebound tenderness Extremities: without edema; without calf swelling or tenderness; symmetrical without gross deformities Skin: warm and dry; without rash or lesions Neuro: normal gait Psychological: alert and cooperative; normal mood and affect  Imaging: DG Chest 2 View  Result Date: 08/12/2020 CLINICAL DATA:  Right-sided chest pain EXAM: CHEST - 2 VIEW COMPARISON:  05/17/2019 FINDINGS: The heart size and mediastinal contours are within normal limits. Both lungs are clear. The visualized skeletal structures are unremarkable. IMPRESSION: No active cardiopulmonary disease. Electronically Signed   By: 07/15/2019 M.D.   On: 08/12/2020 19:56     No Known Allergies  Past Medical History:  Diagnosis Date  . Asthma   . Hypospadias    Social History   Socioeconomic History  . Marital status: Single    Spouse name: Not on file  . Number of children: Not on file  . Years of education: Not on file  . Highest education level: Not on file  Occupational History  . Not on file  Tobacco Use  . Smoking status: Never Smoker  . Smokeless tobacco: Never Used  Vaping Use  . Vaping Use: Never used  Substance and Sexual Activity  . Alcohol use: No  . Drug use: Yes    Types: Marijuana    Comment: Daily.   10/12/2020 Sexual activity: Yes    Birth control/protection: Condom  Other Topics Concern  . Not on file  Social History Narrative  . Not on file   Social Determinants of Health   Financial Resource Strain: Not on file  Food Insecurity: Not on file  Transportation Needs: Not on file  Physical Activity: Not on file  Stress: Not on file  Social Connections: Not on file  Intimate Partner Violence: Not on file   Family History  Problem Relation Age of Onset  . Healthy Mother   .  Healthy Father    Past Surgical History:  Procedure Laterality Date  . COSMETIC SURGERY    . FINGER SURGERY    . HYPOSPADIAS CORRECTION       Mardella Layman, MD 08/13/20 [phone]

## 2021-08-02 ENCOUNTER — Telehealth: Payer: Self-pay

## 2021-08-02 NOTE — Telephone Encounter (Signed)
NOTES SCANNED TO REFERRAL 

## 2021-08-04 ENCOUNTER — Ambulatory Visit
Admission: RE | Admit: 2021-08-04 | Discharge: 2021-08-04 | Disposition: A | Discharge status: Home / Self Care | Payer: BC Managed Care – PPO | Source: Ambulatory Visit | Attending: Family Medicine | Admitting: Family Medicine

## 2021-08-04 ENCOUNTER — Other Ambulatory Visit: Payer: Self-pay | Admitting: Family Medicine

## 2021-08-04 DIAGNOSIS — R0602 Shortness of breath: Secondary | ICD-10-CM

## 2021-08-04 DIAGNOSIS — R61 Generalized hyperhidrosis: Secondary | ICD-10-CM

## 2021-08-09 ENCOUNTER — Ambulatory Visit: Payer: BC Managed Care – PPO | Admitting: Internal Medicine

## 2021-08-09 NOTE — Progress Notes (Deleted)
Cardiology Office Note:    Date:  08/09/2021   ID:  Priscille Kluver, DOB 12/13/1995, MRN [id number]  PCP:  Aliene Beams, MD   [facility] Providers Cardiologist:  None { Click to update primary MD,subspecialty MD or APP then REFRESH:1}    Referring MD: Aliene Beams, MD   No chief complaint on file. ***  History of Present Illness:    Johnny Garza is a 26 y.o. male with a hx of asthma reported episodes of tachycardia per patient and family hx of congential heart disease  Past Medical History:  Diagnosis Date   Asthma    Hypospadias     Past Surgical History:  Procedure Laterality Date   COSMETIC SURGERY     FINGER SURGERY     HYPOSPADIAS CORRECTION      Current Medications: No outpatient medications have been marked as taking for the 08/09/21 encounter (Appointment) with Maisie Fus, MD.     Allergies:   Patient has no known allergies.   Social History   Socioeconomic History   Marital status: Single    Spouse name: Not on file   Number of children: Not on file   Years of education: Not on file   Highest education level: Not on file  Occupational History   Not on file  Tobacco Use   Smoking status: Never   Smokeless tobacco: Never  Vaping Use   Vaping Use: Never used  Substance and Sexual Activity   Alcohol use: No   Drug use: Yes    Types: Marijuana    Comment: Daily.    Sexual activity: Yes    Birth control/protection: Condom  Other Topics Concern   Not on file  Social History Narrative   Not on file   Social Determinants of Health   Financial Resource Strain: Not on file  Food Insecurity: Not on file  Transportation Needs: Not on file  Physical Activity: Not on file  Stress: Not on file  Social Connections: Not on file     Family History: The patient's ***family history includes Healthy in his father and mother.  ROS:   Please see the history of present illness.    *** All other systems reviewed and are  negative.  EKGs/Labs/Other Studies Reviewed:    The following studies were reviewed today: ***  EKG:  EKG is *** ordered today.  The ekg ordered today demonstrates ***  Recent Labs: No results found for requested labs within last 8760 hours.  Recent Lipid Panel No results found for: CHOL, TRIG, HDL, CHOLHDL, VLDL, LDLCALC, LDLDIRECT   Risk Assessment/Calculations:   {Does this patient have ATRIAL FIBRILLATION?:[id number]}       Physical Exam:    VS:  There were no vitals taken for this visit.    Wt Readings from Last 3 Encounters:  12/24/19 158 lb (71.7 kg)  08/06/19 158 lb (71.7 kg)  04/02/19 155 lb (70.3 kg)     GEN: *** Well nourished, well developed in no acute distress HEENT: Normal NECK: No JVD; No carotid bruits LYMPHATICS: No lymphadenopathy CARDIAC: ***RRR, no murmurs, rubs, gallops RESPIRATORY:  Clear to auscultation without rales, wheezing or rhonchi  ABDOMEN: Soft, non-tender, non-distended MUSCULOSKELETAL:  No edema; No deformity  SKIN: Warm and dry NEUROLOGIC:  Alert and oriented x 3 PSYCHIATRIC:  Normal affect   ASSESSMENT:    No diagnosis found. PLAN:    In order of problems listed above:  TTE      {Are you ordering  a CV Procedure (e.g. stress test, cath, DCCV, TEE, etc)?   Press F2        :[id number]}    Medication Adjustments/Labs and Tests Ordered: Current medicines are reviewed at length with the patient today.  Concerns regarding medicines are outlined above.  No orders of the defined types were placed in this encounter.  No orders of the defined types were placed in this encounter.   There are no Patient Instructions on file for this visit.   Signed, Maisie Fus, MD  08/09/2021 8:45 AM    [facility]

## 2021-08-18 ENCOUNTER — Institutional Professional Consult (permissible substitution): Payer: Self-pay | Admitting: Pulmonary Disease

## 2021-09-07 ENCOUNTER — Institutional Professional Consult (permissible substitution): Payer: BC Managed Care – PPO | Admitting: Pulmonary Disease

## 2022-03-03 ENCOUNTER — Ambulatory Visit (HOSPITAL_COMMUNITY)
Admission: EM | Admit: 2022-03-03 | Discharge: 2022-03-03 | Disposition: A | Discharge status: Home / Self Care | Payer: BC Managed Care – PPO | Attending: Internal Medicine | Admitting: Internal Medicine

## 2022-03-03 ENCOUNTER — Encounter (HOSPITAL_COMMUNITY): Payer: Self-pay | Admitting: Emergency Medicine

## 2022-03-03 DIAGNOSIS — Z202 Contact with and (suspected) exposure to infections with a predominantly sexual mode of transmission: Secondary | ICD-10-CM | POA: Insufficient documentation

## 2022-03-03 MED ORDER — DOXYCYCLINE HYCLATE 100 MG PO CAPS: 100.0000 mg | ORAL_CAPSULE | Freq: Two times a day (BID) | ORAL | 0 refills | Status: DC

## 2022-03-03 NOTE — ED Triage Notes (Signed)
Pt reports exposure to Chlamydia and Herpes. Denies any current symptoms.

## 2022-03-03 NOTE — Discharge Instructions (Signed)
Your STD testing has been sent to the lab and will come back in the next 2 to 3 days.  We will call you if any of your results are positive or if any of your results change our current treatment plan.  Take doxycycline 2 times a day for the next 7 days to treat likely chlamydia infection. Take this medicine with food to avoid stomach upset.  Avoid sexual intercourse while being treated (for 7 days).  Return to urgent care as needed.

## 2022-03-03 NOTE — ED Provider Notes (Signed)
Johnny Garza    CSN: [id number] Arrival date & time: 03/03/22  1326      History   Chief Complaint Chief Complaint  Patient presents with   Exposure to STD    HPI Johnny Garza is a 26 y.o. male.   Patient presents urgent care for evaluation after he was exposed to STD by his girlfriend.  Girlfriend was tested for STDs and tested positive for genital herpes as well as chlamydia.  Patient is not having any penile discharge, rash, itching, or dysuria.  No urinary hesitancy, frequency, or urgency or to report.  No fever/chills, abdominal pain, nausea, vomiting, diarrhea, constipation, or flank pain.  Patient found out that his partner had cheated on him earlier in the month and would like to be tested for all STDs.  Declines blood work today to test for HIV and syphilis.    Exposure to STD    Past Medical History:  Diagnosis Date   Asthma    Hypospadias     There are no problems to display for this patient.   Past Surgical History:  Procedure Laterality Date   COSMETIC SURGERY     FINGER SURGERY     HYPOSPADIAS CORRECTION         Home Medications    Prior to Admission medications   Medication Sig Start Date End Date Taking? Authorizing Provider  doxycycline (VIBRAMYCIN) 100 MG capsule Take 1 capsule (100 mg total) by mouth 2 (two) times daily. 03/03/22  Yes Manas Hickling, Stasia Cavalier, FNP    Family History Family History  Problem Relation Age of Onset   Healthy Mother    Healthy Father     Social History Social History   Tobacco Use   Smoking status: Never   Smokeless tobacco: Never  Vaping Use   Vaping Use: Never used  Substance Use Topics   Alcohol use: No   Drug use: Yes    Types: Marijuana    Comment: Daily.      Allergies   Patient has no known allergies.   Review of Systems Review of Systems Per HPI  Physical Exam Triage Vital Signs ED Triage Vitals  Enc Vitals Group     BP 03/03/22 1508 (!) 133/91     Pulse Rate  03/03/22 1508 100     Resp 03/03/22 1508 17     Temp 03/03/22 1508 98.5 F (36.9 C)     Temp Source 03/03/22 1508 Oral     SpO2 03/03/22 1508 96 %     Weight --      Height --      Head Circumference --      Peak Flow --      Pain Score 03/03/22 1506 0     Pain Loc --      Pain Edu? --      Excl. in [place]? --    No data found.  Updated Vital Signs BP (!) 133/91 (BP Location: Left Arm)   Pulse 100   Temp 98.5 F (36.9 C) (Oral)   Resp 17   SpO2 96%   Visual Acuity Right Eye Distance:   Left Eye Distance:   Bilateral Distance:    Right Eye Near:   Left Eye Near:    Bilateral Near:     Physical Exam Vitals and nursing note reviewed.  Constitutional:      Appearance: He is not ill-appearing or toxic-appearing.  HENT:     Head: Normocephalic and atraumatic.  Right Ear: Hearing and external ear normal.     Left Ear: Hearing and external ear normal.     Nose: Nose normal.     Mouth/Throat:     Lips: Pink.     Mouth: Mucous membranes are moist.     Pharynx: No posterior oropharyngeal erythema.  Eyes:     General: Lids are normal. Vision grossly intact. Gaze aligned appropriately.     Extraocular Movements: Extraocular movements intact.     Conjunctiva/sclera: Conjunctivae normal.  Pulmonary:     Effort: Pulmonary effort is normal.  Genitourinary:    Comments: Deferred. Musculoskeletal:     Cervical back: Neck supple.  Skin:    General: Skin is warm and dry.     Capillary Refill: Capillary refill takes less than 2 seconds.     Findings: No rash.  Neurological:     General: No focal deficit present.     Mental Status: He is alert and oriented to person, place, and time. Mental status is at baseline.     Cranial Nerves: No dysarthria or facial asymmetry.  Psychiatric:        Mood and Affect: Mood normal.        Speech: Speech normal.        Behavior: Behavior normal.        Thought Content: Thought content normal.        Judgment: Judgment normal.       UC Treatments / Results  Labs (all labs ordered are listed, but only abnormal results are displayed) Labs Reviewed - No data to display  EKG   Radiology No results found.  Procedures Procedures (including critical care time)  Medications Ordered in UC Medications - No data to display  Initial Impression / Assessment and Plan / UC Course  I have reviewed the triage vital signs and the nursing notes.  Pertinent labs & imaging results that were available during my care of the patient were reviewed by me and considered in my medical decision making (see chart for details).   1.  STD screening STI labs pending.  Patient declines HIV and syphilis testing today.  Will notify patient of positive results and treat accordingly when labs come back.  We will go ahead and treat empirically for chlamydia today with doxycycline twice daily for the next 7 days.  Patient to take this medicine with food to avoid stomach upset.  Patient to avoid sexual intercourse until screening testing comes back.  Education provided regarding safe sexual practices and patient encouraged to use protection to prevent spread of STIs.   Discussed physical exam and available lab work findings in clinic with patient.  Counseled patient regarding appropriate use of medications and potential side effects for all medications recommended or prescribed today. Discussed red flag signs and symptoms of worsening condition,when to call the PCP office, return to urgent care, and when to seek higher level of care in the emergency department. Patient verbalizes understanding and agreement with plan. All questions answered. Patient discharged in stable condition.    Final Clinical Impressions(s) / UC Diagnoses   Final diagnoses:  STD exposure     Discharge Instructions      Your STD testing has been sent to the lab and will come back in the next 2 to 3 days.  We will call you if any of your results are positive or if  any of your results change our current treatment plan.  Take doxycycline 2 times a day for  the next 7 days to treat likely chlamydia infection. Take this medicine with food to avoid stomach upset.  Avoid sexual intercourse while being treated (for 7 days).  Return to urgent care as needed.     ED Prescriptions     Medication Sig Dispense Auth. Provider   doxycycline (VIBRAMYCIN) 100 MG capsule Take 1 capsule (100 mg total) by mouth 2 (two) times daily. 20 capsule Talbot Grumbling, FNP      PDMP not reviewed this encounter.   Talbot Grumbling, [place] 03/03/22 1610

## 2022-03-04 LAB — CYTOLOGY, (ORAL, ANAL, URETHRAL) ANCILLARY ONLY
Chlamydia: POSITIVE — AB
Comment: NEGATIVE
Comment: NEGATIVE
Comment: NORMAL
Neisseria Gonorrhea: NEGATIVE
Trichomonas: NEGATIVE

## 2022-12-25 IMAGING — DX DG CHEST 2V
2 series · 2 of 2 positions shown · non-contrast
Comparison: 05/17/2019

CLINICAL DATA: Right-sided chest pain

EXAM:
CHEST - 2 VIEW

[chest pa]
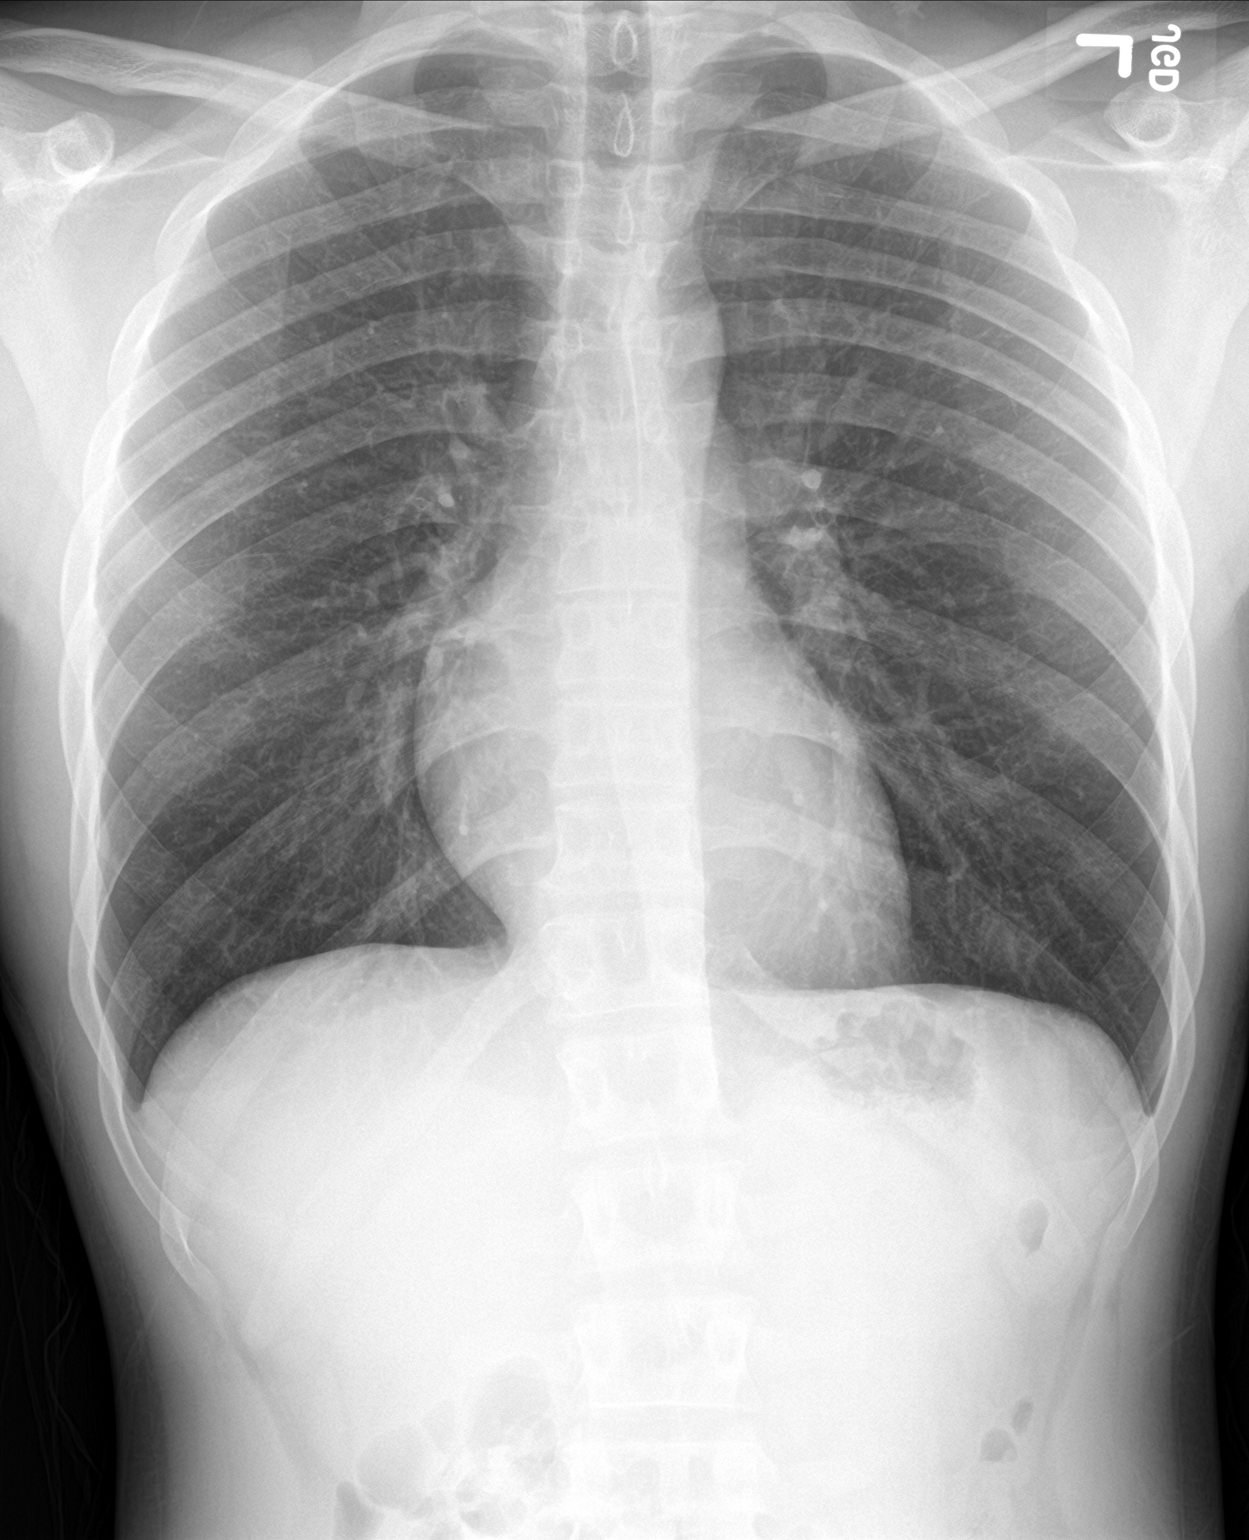

[chest lat]
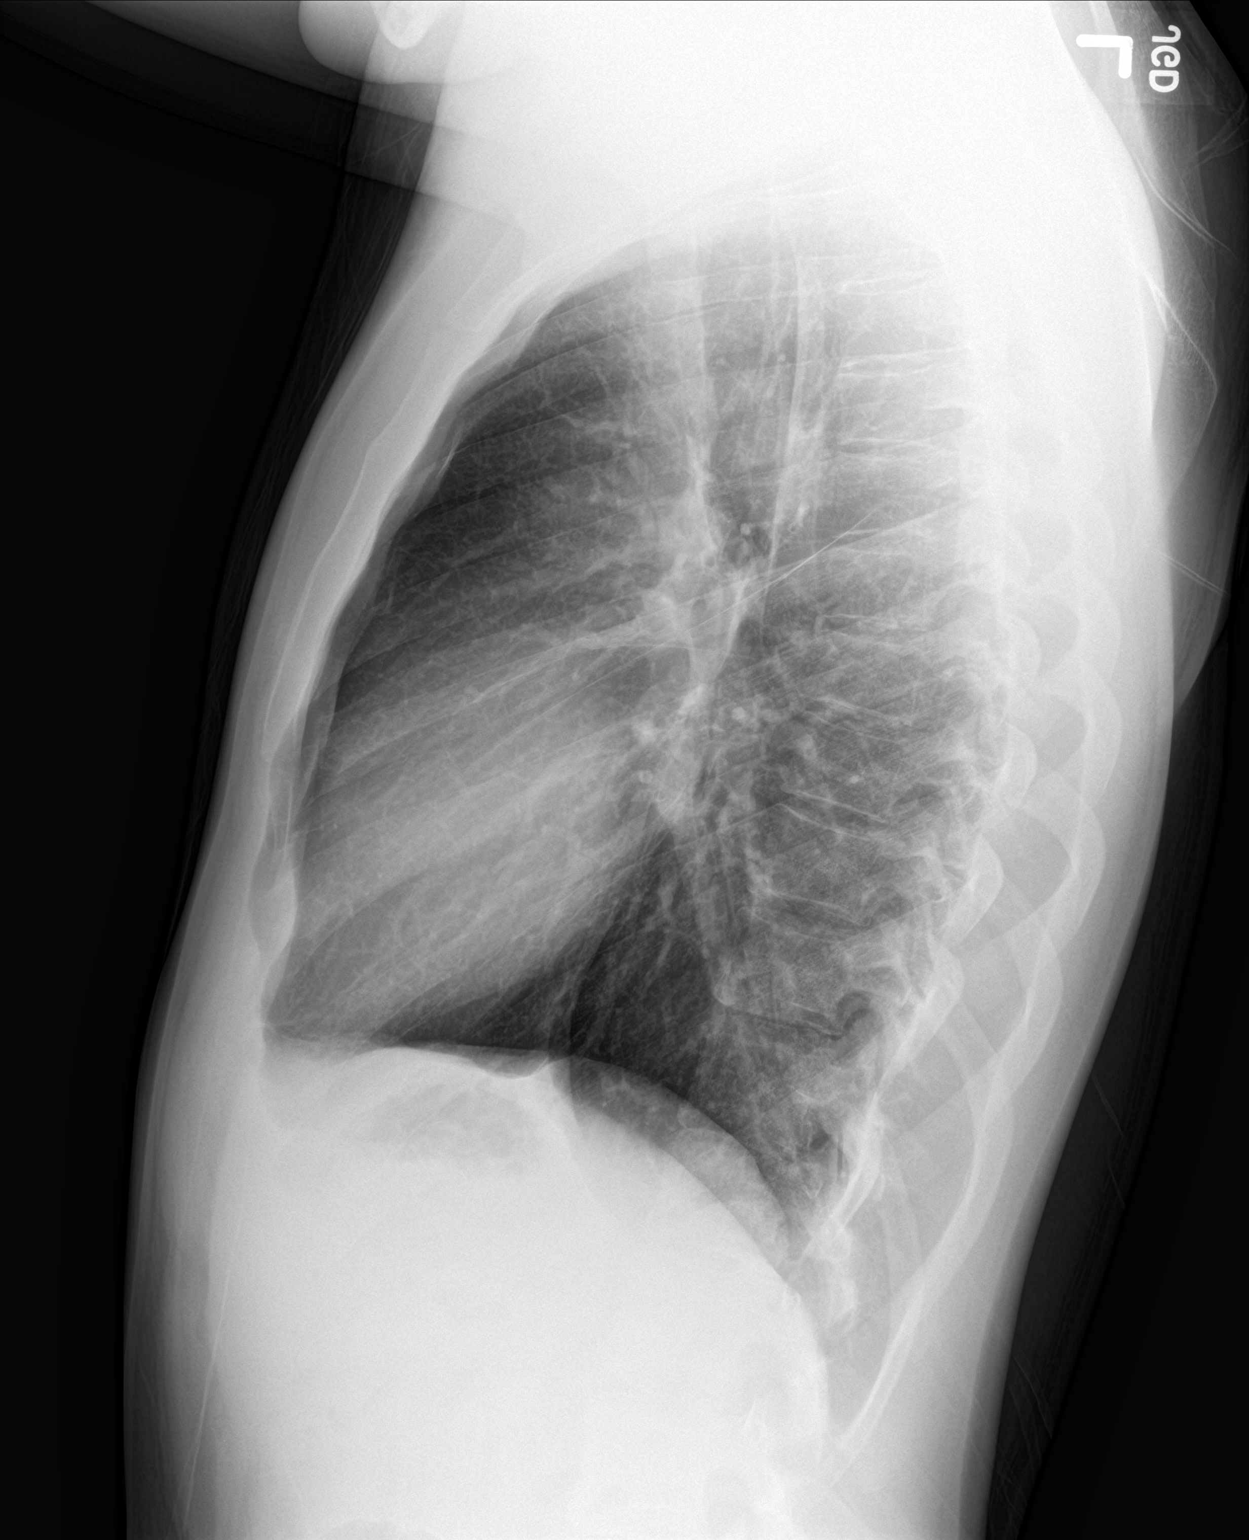

[2 of 2 positions shown; findings below may reference images not displayed]

FINDINGS: The heart size and mediastinal contours are within normal limits.
Both lungs are clear. The visualized skeletal structures are
unremarkable.
IMPRESSION: No active cardiopulmonary disease.

## 2023-02-10 ENCOUNTER — Ambulatory Visit
Admission: EM | Admit: 2023-02-10 | Discharge: 2023-02-10 | Disposition: A | Discharge status: Home / Self Care | Payer: Self-pay | Attending: Physician Assistant | Admitting: Physician Assistant

## 2023-02-10 ENCOUNTER — Encounter: Payer: Self-pay | Admitting: Emergency Medicine

## 2023-02-10 DIAGNOSIS — Z113 Encounter for screening for infections with a predominantly sexual mode of transmission: Secondary | ICD-10-CM | POA: Insufficient documentation

## 2023-02-10 DIAGNOSIS — N4889 Other specified disorders of penis: Secondary | ICD-10-CM | POA: Insufficient documentation

## 2023-02-10 LAB — URINALYSIS, W/ REFLEX TO CULTURE (INFECTION SUSPECTED)
Bilirubin Urine: NEGATIVE
Glucose, UA: NEGATIVE mg/dL
Hgb urine dipstick: NEGATIVE
Ketones, ur: NEGATIVE mg/dL
Leukocytes,Ua: NEGATIVE
Nitrite: NEGATIVE
Protein, ur: NEGATIVE mg/dL
Specific Gravity, Urine: 1.02 (ref 1.005–1.030)
pH: 7 (ref 5.0–8.0)

## 2023-02-10 MED ORDER — CLOTRIMAZOLE 1 % EX CREA: TOPICAL_CREAM | CUTANEOUS | 0 refills | Status: DC

## 2023-02-10 NOTE — Discharge Instructions (Addendum)
-  You do not have a UTI. - Your exam was unremarkable but I have sent a antifungal cream to cover you for yeast given your concerns. - The results of your STD testing will be back in the next couple days and the results will come through MyChart.  Will call you if anything is positive.  If positive you and your partner will need to be treated and will be reported to the health department.

## 2023-02-10 NOTE — ED Provider Notes (Signed)
[facility]    CSN: [id number] Arrival date & time: 02/10/23  1615      History   Chief Complaint Chief Complaint  Patient presents with   Exposure to STD    HPI Johnny Garza is a 27 y.o. male presenting for "possible UTI or yeast infection or STD."  Patient says his girlfriend had a UTI recently and was treated by telemedicine visit with antibiotics.  He also reports that the skin around his penis is "ashy."  Denies any rashes, discharge, itching or irritation.  Denies dysuria, frequency, urgency, testicular pain or swelling.  Request STI testing.  No other concerns.  HPI  Past Medical History:  Diagnosis Date   Asthma    Hypospadias     There are no problems to display for this patient.   Past Surgical History:  Procedure Laterality Date   COSMETIC SURGERY     FINGER SURGERY     HYPOSPADIAS CORRECTION         Home Medications    Prior to Admission medications   Medication Sig Start Date End Date Taking? Authorizing Provider  amoxicillin (AMOXIL) 500 MG tablet Take 500 mg by mouth every 8 (eight) hours. 01/23/23  Yes [provider]  clotrimazole (LOTRIMIN) 1 % cream Apply to affected area 2 times daily 02/10/23  Yes Shirlee Latch, PA-C    Family History Family History  Problem Relation Age of Onset   Healthy Mother    Healthy Father     Social History Social History   Tobacco Use   Smoking status: Never   Smokeless tobacco: Never  Vaping Use   Vaping status: Never Used  Substance Use Topics   Alcohol use: No   Drug use: Yes    Types: Marijuana    Comment: Daily.      Allergies   Patient has no known allergies.   Review of Systems Review of Systems  Constitutional:  Negative for fatigue and fever.  Gastrointestinal:  Negative for abdominal pain, nausea and vomiting.  Genitourinary:  Negative for dysuria, frequency, genital sores, hematuria, penile discharge, penile pain, penile swelling, scrotal swelling,  testicular pain and urgency.  Musculoskeletal:  Negative for arthralgias.  Skin:  Negative for rash.       Dry ashy skin  Neurological:  Negative for weakness.     Physical Exam Triage Vital Signs ED Triage Vitals  Encounter Vitals Group     BP 02/10/23 1624 (!) 144/91     Systolic BP Percentile --      Diastolic BP Percentile --      Pulse Rate 02/10/23 1624 75     Resp 02/10/23 1624 15     Temp 02/10/23 1624 98.6 F (37 C)     Temp Source 02/10/23 1624 Oral     SpO2 02/10/23 1624 98 %     Weight 02/10/23 1623 158 lb 1.1 oz (71.7 kg)     Height 02/10/23 1623 5\' 11"  (1.803 m)     Head Circumference --      Peak Flow --      Pain Score 02/10/23 1623 0     Pain Loc --      Pain Education --      Exclude from Growth Chart --    No data found.  Updated Vital Signs BP (!) 144/91 (BP Location: Left Arm)   Pulse 75   Temp 98.6 F (37 C) (Oral)   Resp 15   Ht 5'  11" (1.803 m)   Wt 158 lb 1.1 oz (71.7 kg)   SpO2 98%   BMI 22.05 kg/m     Physical Exam Vitals and nursing note reviewed.  Constitutional:      General: He is not in acute distress.    Appearance: Normal appearance. He is well-developed. He is not ill-appearing.  HENT:     Head: Normocephalic and atraumatic.  Eyes:     General: No scleral icterus.    Conjunctiva/sclera: Conjunctivae normal.  Cardiovascular:     Rate and Rhythm: Normal rate and regular rhythm.  Pulmonary:     Effort: Pulmonary effort is normal. No respiratory distress.     Breath sounds: Normal breath sounds.  Abdominal:     Palpations: Abdomen is soft.     Tenderness: There is no abdominal tenderness.  Genitourinary:    Pubic Area: No rash.      Penis: Circumcised. No erythema, discharge, swelling or lesions.      Testes: Normal.  Musculoskeletal:     Cervical back: Neck supple.  Skin:    General: Skin is warm and dry.     Capillary Refill: Capillary refill takes less than 2 seconds.  Neurological:     General: No focal  deficit present.     Mental Status: He is alert. Mental status is at baseline.     Motor: No weakness.     Gait: Gait normal.  Psychiatric:        Mood and Affect: Mood normal.        Behavior: Behavior normal.      UC Treatments / Results  Labs (all labs ordered are listed, but only abnormal results are displayed) Labs Reviewed  URINALYSIS, W/ REFLEX TO CULTURE (INFECTION SUSPECTED) - Abnormal; Notable for the following components:      Result Value   Bacteria, UA FEW (*)    All other components within normal limits  CYTOLOGY, (ORAL, ANAL, URETHRAL) ANCILLARY ONLY    EKG   Radiology No results found.  Procedures Procedures (including critical care time)  Medications Ordered in UC Medications - No data to display  Initial Impression / Assessment and Plan / UC Course  I have reviewed the triage vital signs and the nursing notes.  Pertinent labs & imaging results that were available during my care of the patient were reviewed by me and considered in my medical decision making (see chart for details).   27 year old male presents for try-she skin of penis, concerns about possible STI and request for urine test to check for UTI.  GU exam is within normal limits.  Urinalysis not consistent with UTI.  Pending STI testing for GC/chlamydia/trichomonas.  Will treat accordingly if positive.  I did send clotrimazole cream to pharmacy for possible yeast infection given his concerns.  Reviewed return precautions.   Final Clinical Impressions(s) / UC Diagnoses   Final diagnoses:  Penile irritation  Routine screening for STI (sexually transmitted infection)     Discharge Instructions      -You do not have a UTI. - Your exam was unremarkable but I have sent a antifungal cream to cover you for yeast given your concerns. - The results of your STD testing will be back in the next couple days and the results will come through MyChart.  Will call you if anything is positive.   If positive you and your partner will need to be treated and will be reported to the health department.     ED  Prescriptions     Medication Sig Dispense Auth. Provider   clotrimazole (LOTRIMIN) 1 % cream Apply to affected area 2 times daily 15 g Shirlee Latch, PA-C      PDMP not reviewed this encounter.   Shirlee Latch, PA-C 02/10/23 1728

## 2023-02-10 NOTE — ED Triage Notes (Signed)
Patient states that the skin around his penis has been irritated for couple of days.  Patient states that he wants to be tested for UTI and STD.  Patient denies any penile discharge.  Patient denies any dysuria or urinary frequency.

## 2023-02-13 LAB — CYTOLOGY, (ORAL, ANAL, URETHRAL) ANCILLARY ONLY
Chlamydia: NEGATIVE
Comment: NEGATIVE
Comment: NEGATIVE
Comment: NORMAL
Neisseria Gonorrhea: NEGATIVE
Trichomonas: NEGATIVE

## 2023-03-16 ENCOUNTER — Ambulatory Visit (HOSPITAL_COMMUNITY)
Admission: EM | Admit: 2023-03-16 | Discharge: 2023-03-16 | Disposition: A | Discharge status: Home / Self Care | Payer: Self-pay | Attending: Internal Medicine | Admitting: Internal Medicine

## 2023-03-16 ENCOUNTER — Encounter (HOSPITAL_COMMUNITY): Payer: Self-pay

## 2023-03-16 ENCOUNTER — Ambulatory Visit (HOSPITAL_COMMUNITY): Payer: Self-pay

## 2023-03-16 DIAGNOSIS — M25541 Pain in joints of right hand: Secondary | ICD-10-CM

## 2023-03-16 MED ORDER — IBUPROFEN 600 MG PO TABS: 600.0000 mg | ORAL_TABLET | Freq: Four times a day (QID) | ORAL | 0 refills | Status: DC | PRN

## 2023-03-16 NOTE — ED Triage Notes (Signed)
Pt reports right hand injury. Pt states "you know those machines that tell you how hard you can punch, I did that back in October but never got my hand checked out. Yesterday, I hit my hand while I was at work and the pain came back. It is about an 8/10 right now." Pt denies taking medications for his pain.

## 2023-03-16 NOTE — Discharge Instructions (Signed)
Rest and elevate the affected painful area. Apply cold compresses intermittently as needed. X-rays negative for fracture Ibuprofen as needed for pain As pain recedes, begin normal activities slowly as tolerated. Call if symptoms persist.

## 2023-03-16 NOTE — ED Provider Notes (Signed)
[facility]    CSN: [id number] Arrival date & time: 03/16/23  1005      History   Chief Complaint Chief Complaint  Patient presents with   Hand Injury    HPI Johnny Garza is a 27 y.o. male comes to the urgent care with right hand pain which started yesterday after his dog tried to run off when he was walking the dog.  The lesion expectedly pulled on his right hand resulting in worsening pain.  A couple of weeks ago he punched a metal pole resulting in pain in the third metacarpophalangeal joint.  The pain subsided but has worsened since yesterday after the dog yanked on it leash.  Patient describes the pain as severe this morning, sharp and aggravated by movement.  No known relieving factors.  Mild swelling over the third metacarpophalangeal joint with no obvious deformity.   HPI  Past Medical History:  Diagnosis Date   Asthma    Hypospadias     There are no active problems to display for this patient.   Past Surgical History:  Procedure Laterality Date   COSMETIC SURGERY     FINGER SURGERY     HYPOSPADIAS CORRECTION         Home Medications    Prior to Admission medications   Medication Sig Start Date End Date Taking? Authorizing Provider  ibuprofen (ADVIL) 600 MG tablet Take 1 tablet (600 mg total) by mouth every 6 (six) hours as needed. 03/16/23  Yes Tristin Gladman, Britta Mccreedy, MD    Family History Family History  Problem Relation Age of Onset   Healthy Mother    Healthy Father     Social History Social History   Tobacco Use   Smoking status: Never   Smokeless tobacco: Never  Vaping Use   Vaping status: Never Used  Substance Use Topics   Alcohol use: No   Drug use: Yes    Types: Marijuana    Comment: Daily.      Allergies   Patient has no known allergies.   Review of Systems Review of Systems As per HPI  Physical Exam Triage Vital Signs ED Triage Vitals  Encounter Vitals Group     BP 03/16/23 1045 121/74     Systolic BP  Percentile --      Diastolic BP Percentile --      Pulse Rate 03/16/23 1045 91     Resp 03/16/23 1045 18     Temp 03/16/23 1045 98.3 F (36.8 C)     Temp Source 03/16/23 1045 Oral     SpO2 03/16/23 1045 96 %     Weight 03/16/23 1044 165 lb (74.8 kg)     Height 03/16/23 1044 5\' 11"  (1.803 m)     Head Circumference --      Peak Flow --      Pain Score 03/16/23 1044 8     Pain Loc --      Pain Education --      Exclude from Growth Chart --    No data found.  Updated Vital Signs BP 121/74 (BP Location: Left Arm)   Pulse 91   Temp 98.3 F (36.8 C) (Oral)   Resp 18   Ht 5\' 11"  (1.803 m)   Wt 74.8 kg   SpO2 96%   BMI 23.01 kg/m   Visual Acuity Right Eye Distance:   Left Eye Distance:   Bilateral Distance:    Right Eye Near:   Left Eye  Near:    Bilateral Near:     Physical Exam Vitals and nursing note reviewed.  Constitutional:      Appearance: Normal appearance.  Pulmonary:     Effort: Pulmonary effort is normal.     Breath sounds: Normal breath sounds.  Abdominal:     General: Bowel sounds are normal.     Palpations: Abdomen is soft.  Musculoskeletal:     Comments: Limited range of motion at the third metacarpophalangeal joint of the right hand.  Mild swelling without bruising.  No numbness or tingling.  Capillary refill is less than 2 seconds.  Neurological:     Mental Status: He is alert.      UC Treatments / Results  Labs (all labs ordered are listed, but only abnormal results are displayed) Labs Reviewed - No data to display  EKG   Radiology DG Hand Complete Right Result Date: 03/16/2023 CLINICAL DATA:  right hand pain EXAM: RIGHT HAND - COMPLETE 3+ VIEW COMPARISON:  October 30, 2013 FINDINGS: No acute fracture or dislocation. Joint spaces and alignment are maintained. No area of erosion or osseous destruction. No unexpected radiopaque foreign body. Soft tissues are unremarkable. IMPRESSION: No acute fracture or dislocation. Electronically Signed   By:  Meda Klinefelter M.D.   On: 03/16/2023 11:43    Procedures Procedures (including critical care time)  Medications Ordered in UC Medications - No data to display  Initial Impression / Assessment and Plan / UC Course  I have reviewed the triage vital signs and the nursing notes.  Pertinent labs & imaging results that were available during my care of the patient were reviewed by me and considered in my medical decision making (see chart for details).     1.  Metacarpophalangeal joint sprain: Ibuprofen 600 mg every 6-8 hours as needed for pain X-ray of the right hand is negative for acute fracture Icing of the right hand As the pain improves please perform gentle range of motion exercises. Return precautions given. Final Clinical Impressions(s) / UC Diagnoses   Final diagnoses:  Metacarpophalangeal joint pain of right hand     Discharge Instructions      Rest and elevate the affected painful area. Apply cold compresses intermittently as needed. X-rays negative for fracture Ibuprofen as needed for pain As pain recedes, begin normal activities slowly as tolerated. Call if symptoms persist.     ED Prescriptions     Medication Sig Dispense Auth. Provider   ibuprofen (ADVIL) 600 MG tablet Take 1 tablet (600 mg total) by mouth every 6 (six) hours as needed. 30 tablet Javius Sylla, Britta Mccreedy, MD      PDMP not reviewed this encounter.   Merrilee Jansky, MD 03/16/23 [phone]

## 2023-10-10 ENCOUNTER — Ambulatory Visit
Admission: EM | Admit: 2023-10-10 | Discharge: 2023-10-10 | Disposition: A | Discharge status: Home / Self Care | Attending: Emergency Medicine | Admitting: Emergency Medicine

## 2023-10-10 DIAGNOSIS — Z113 Encounter for screening for infections with a predominantly sexual mode of transmission: Secondary | ICD-10-CM | POA: Diagnosis not present

## 2023-10-10 DIAGNOSIS — N451 Epididymitis: Secondary | ICD-10-CM | POA: Insufficient documentation

## 2023-10-10 LAB — URINALYSIS, W/ REFLEX TO CULTURE (INFECTION SUSPECTED)
Bacteria, UA: NONE SEEN
Bilirubin Urine: NEGATIVE
Glucose, UA: NEGATIVE mg/dL
Hgb urine dipstick: NEGATIVE
Ketones, ur: NEGATIVE mg/dL
Leukocytes,Ua: NEGATIVE
Nitrite: NEGATIVE
Protein, ur: NEGATIVE mg/dL
RBC / HPF: NONE SEEN RBC/hpf (ref 0–5)
Specific Gravity, Urine: >1.03 — ABNORMAL HIGH (ref 1.005–1.030)
Squamous Epithelial / HPF: NONE SEEN /HPF (ref 0–5)
WBC, UA: NONE SEEN WBC/hpf (ref 0–5)
pH: 6 (ref 5.0–8.0)

## 2023-10-10 LAB — HIV ANTIBODY (ROUTINE TESTING W REFLEX): HIV Screen 4th Generation wRfx: NONREACTIVE

## 2023-10-10 MED ORDER — DOXYCYCLINE HYCLATE 100 MG PO CAPS: 100.0000 mg | ORAL_CAPSULE | Freq: Two times a day (BID) | ORAL | 0 refills | Status: AC

## 2023-10-10 NOTE — Discharge Instructions (Addendum)
 Take the doxycycline  twice daily for 7 days for treatment of your epididymitis.  Use over-the-counter Tylenol and ibuprofen  as needed for discomfort.  Wear supportive underwear or jockstrap to support your scrotum and prevent further irritation of your epididymis.  Your blood work and cytology swab for STIs will be back in the next 1 to 2 days.  If you test positive for any infection you will be contacted by phone and treatment options will be provided.  If you test positive for chlamydia you will already be undergoing treatment given that you are taking doxycycline  for your epididymitis, which is the same antibiotic that is used to treat chlamydia.  Avoid sexual intercourse until after your test results are back.  If you choose to engage in sexual intercourse make sure that you are wearing a condom or that another barrier device is in place to prevent potential spread of sexually transmitted infection.

## 2023-10-10 NOTE — ED Triage Notes (Signed)
 Groin pain x 1 week.  Discomfort when he pees.  No penial discharge  Frequent urination Wants to be tested for STD. All testing

## 2023-10-10 NOTE — ED Provider Notes (Signed)
 [facility]    CSN: [id number] Arrival date & time: 10/10/23  0959      History   Chief Complaint Chief Complaint  Patient presents with   Urinary Frequency    HPI ZHYON ANTENUCCI is a 28 y.o. male.   HPI  28 year old male with past medical history significant for hypospadias and asthma presents for evaluation of 1 week worth of groin pain, frequency of urination, and testicular pain.  He denies any fever, rashes or lesions in his genitals, penile discharge, or blood in his urine or ejaculate.  The patient also reported that he had some discomfort when he pees but when asked specifically he said he does not have any pain when he urinates.  He is sexually active with multiple partners but does wear a condom.  Past Medical History:  Diagnosis Date   Asthma    Hypospadias     There are no active problems to display for this patient.   Past Surgical History:  Procedure Laterality Date   COSMETIC SURGERY     FINGER SURGERY     HYPOSPADIAS CORRECTION         Home Medications    Prior to Admission medications   Medication Sig Start Date End Date Taking? Authorizing Provider  doxycycline  (VIBRAMYCIN ) 100 MG capsule Take 1 capsule (100 mg total) by mouth 2 (two) times daily for 7 days. 10/10/23 10/17/23 Yes Bernardino Ditch, NP  ibuprofen  (ADVIL ) 600 MG tablet Take 1 tablet (600 mg total) by mouth every 6 (six) hours as needed. 03/16/23   Lamptey, Aleene KIDD, MD    Family History Family History  Problem Relation Age of Onset   Healthy Mother    Healthy Father     Social History Social History   Tobacco Use   Smoking status: Never   Smokeless tobacco: Never  Vaping Use   Vaping status: Never Used  Substance Use Topics   Alcohol use: No   Drug use: Yes    Types: Marijuana    Comment: Daily.      Allergies   Patient has no known allergies.   Review of Systems Review of Systems  Constitutional:  Negative for fever.  Genitourinary:  Positive for  dysuria, frequency and testicular pain. Negative for genital sores, hematuria, penile discharge, penile pain, penile swelling, scrotal swelling and urgency.  Musculoskeletal:  Negative for back pain.     Physical Exam Triage Vital Signs ED Triage Vitals  Encounter Vitals Group     BP      Girls Systolic BP Percentile      Girls Diastolic BP Percentile      Boys Systolic BP Percentile      Boys Diastolic BP Percentile      Pulse      Resp      Temp      Temp src      SpO2      Weight      Height      Head Circumference      Peak Flow      Pain Score      Pain Loc      Pain Education      Exclude from Growth Chart    No data found.  Updated Vital Signs BP 136/83 (BP Location: Right Arm)   Pulse (!) 59   Temp 98 F (36.7 C) (Oral)   Resp 17   SpO2 98%   Visual Acuity Right Eye Distance:  Left Eye Distance:   Bilateral Distance:    Right Eye Near:   Left Eye Near:    Bilateral Near:     Physical Exam Vitals and nursing note reviewed.  Constitutional:      Appearance: Normal appearance. He is not ill-appearing.  HENT:     Head: Normocephalic and atraumatic.  Genitourinary:    Penis: Normal.      Comments: Mild tenderness with palpation of the left epididymis complex.  Bilateral testicles are normal volume and free of tenderness.  Right epididymis is unremarkable. Neurological:     Mental Status: He is alert.      UC Treatments / Results  Labs (all labs ordered are listed, but only abnormal results are displayed) Labs Reviewed  URINALYSIS, W/ REFLEX TO CULTURE (INFECTION SUSPECTED) - Abnormal; Notable for the following components:      Result Value   Specific Gravity, Urine >1.030 (*)    All other components within normal limits  RPR  HIV ANTIBODY (ROUTINE TESTING W REFLEX)  CYTOLOGY, (ORAL, ANAL, URETHRAL) ANCILLARY ONLY    EKG   Radiology No results found.  Procedures Procedures (including critical care time)  Medications Ordered in  UC Medications - No data to display  Initial Impression / Assessment and Plan / UC Course  I have reviewed the triage vital signs and the nursing notes.  Pertinent labs & imaging results that were available during my care of the patient were reviewed by me and considered in my medical decision making (see chart for details).   Patient is a nontoxic-appearing 28 year old male presenting for evaluation 1 week with a groin pain and requesting STI testing.  He is currently strictly active with multiple partners but does wear a condom.  He states he has had chlamydia in the past and this feels similar to when he had chlamydia but he has no penile discharge.  Genital exam reveals no rashes or lesions on the glans penis or the shaft of the penis.  No discharge from the urethral meatus.  Scrotum is free of erythema or edema.  Both testicles are normal volume, smooth in texture, and nontender.  The left epididymis complex is mildly edematous and tender to palpation.  The right epididymis is clear.  Both spermatic cords are clear.  The patient initially requested blood testing and then declined blood work.  I advised him that since he has multiple partners that he should have the blood work performed just so he is aware of his status.  He is consented to blood work at this point.  I will order urinalysis to assess for the presence of UTI as well as a cytology swab to assess for gonorrhea, chlamydia, trichomonas.  I will also order HIV and RPR.  Given the patient has tenderness to his epididymis I will treat him for epididymitis with a 7-day course of doxycycline .  This would also potentially cover if does turn out positive for chlamydia.  Urinalysis shows high specific gravity but negative for leukocyte esterase, nitrates, protein, ketones, hemoglobin, or glucose.  Reflex microscopy is unremarkable.  I will discharge patient on the diagnosis of epididymitis and routine STI screening.  As mentioned above, I will  start him on a 7-day course of doxycycline  while his STI panel is pending for treatment of his epididymitis.  I have advised him to abstain from sexual intercourse until after his results are back.  If he chooses to engage in sexual intercourse he should do so with a barrier  method in place.   Final Clinical Impressions(s) / UC Diagnoses   Final diagnoses:  Epididymitis  Routine screening for STI (sexually transmitted infection)     Discharge Instructions      Take the doxycycline  twice daily for 7 days for treatment of your epididymitis.  Use over-the-counter Tylenol and ibuprofen  as needed for discomfort.  Wear supportive underwear or jockstrap to support your scrotum and prevent further irritation of your epididymis.  Your blood work and cytology swab for STIs will be back in the next 1 to 2 days.  If you test positive for any infection you will be contacted by phone and treatment options will be provided.  If you test positive for chlamydia you will already be undergoing treatment given that you are taking doxycycline  for your epididymitis, which is the same antibiotic that is used to treat chlamydia.  Avoid sexual intercourse until after your test results are back.  If you choose to engage in sexual intercourse make sure that you are wearing a condom or that another barrier device is in place to prevent potential spread of sexually transmitted infection.     ED Prescriptions     Medication Sig Dispense Auth. Provider   doxycycline  (VIBRAMYCIN ) 100 MG capsule Take 1 capsule (100 mg total) by mouth 2 (two) times daily for 7 days. 14 capsule Bernardino Ditch, NP      PDMP not reviewed this encounter.   Bernardino Ditch, NP 10/10/23 1105

## 2023-10-11 LAB — CYTOLOGY, (ORAL, ANAL, URETHRAL) ANCILLARY ONLY
Chlamydia: NEGATIVE
Comment: NEGATIVE
Comment: NEGATIVE
Comment: NORMAL
Neisseria Gonorrhea: NEGATIVE
Trichomonas: NEGATIVE

## 2023-10-11 LAB — RPR: RPR Ser Ql: NONREACTIVE

## 2023-12-17 IMAGING — CR DG CHEST 2V
2 series · 2 of 2 positions shown · non-contrast
Comparison: 08/12/2020

CLINICAL DATA: Shortness of breath, night sweats

EXAM:
CHEST - 2 VIEW

[w chest pa]
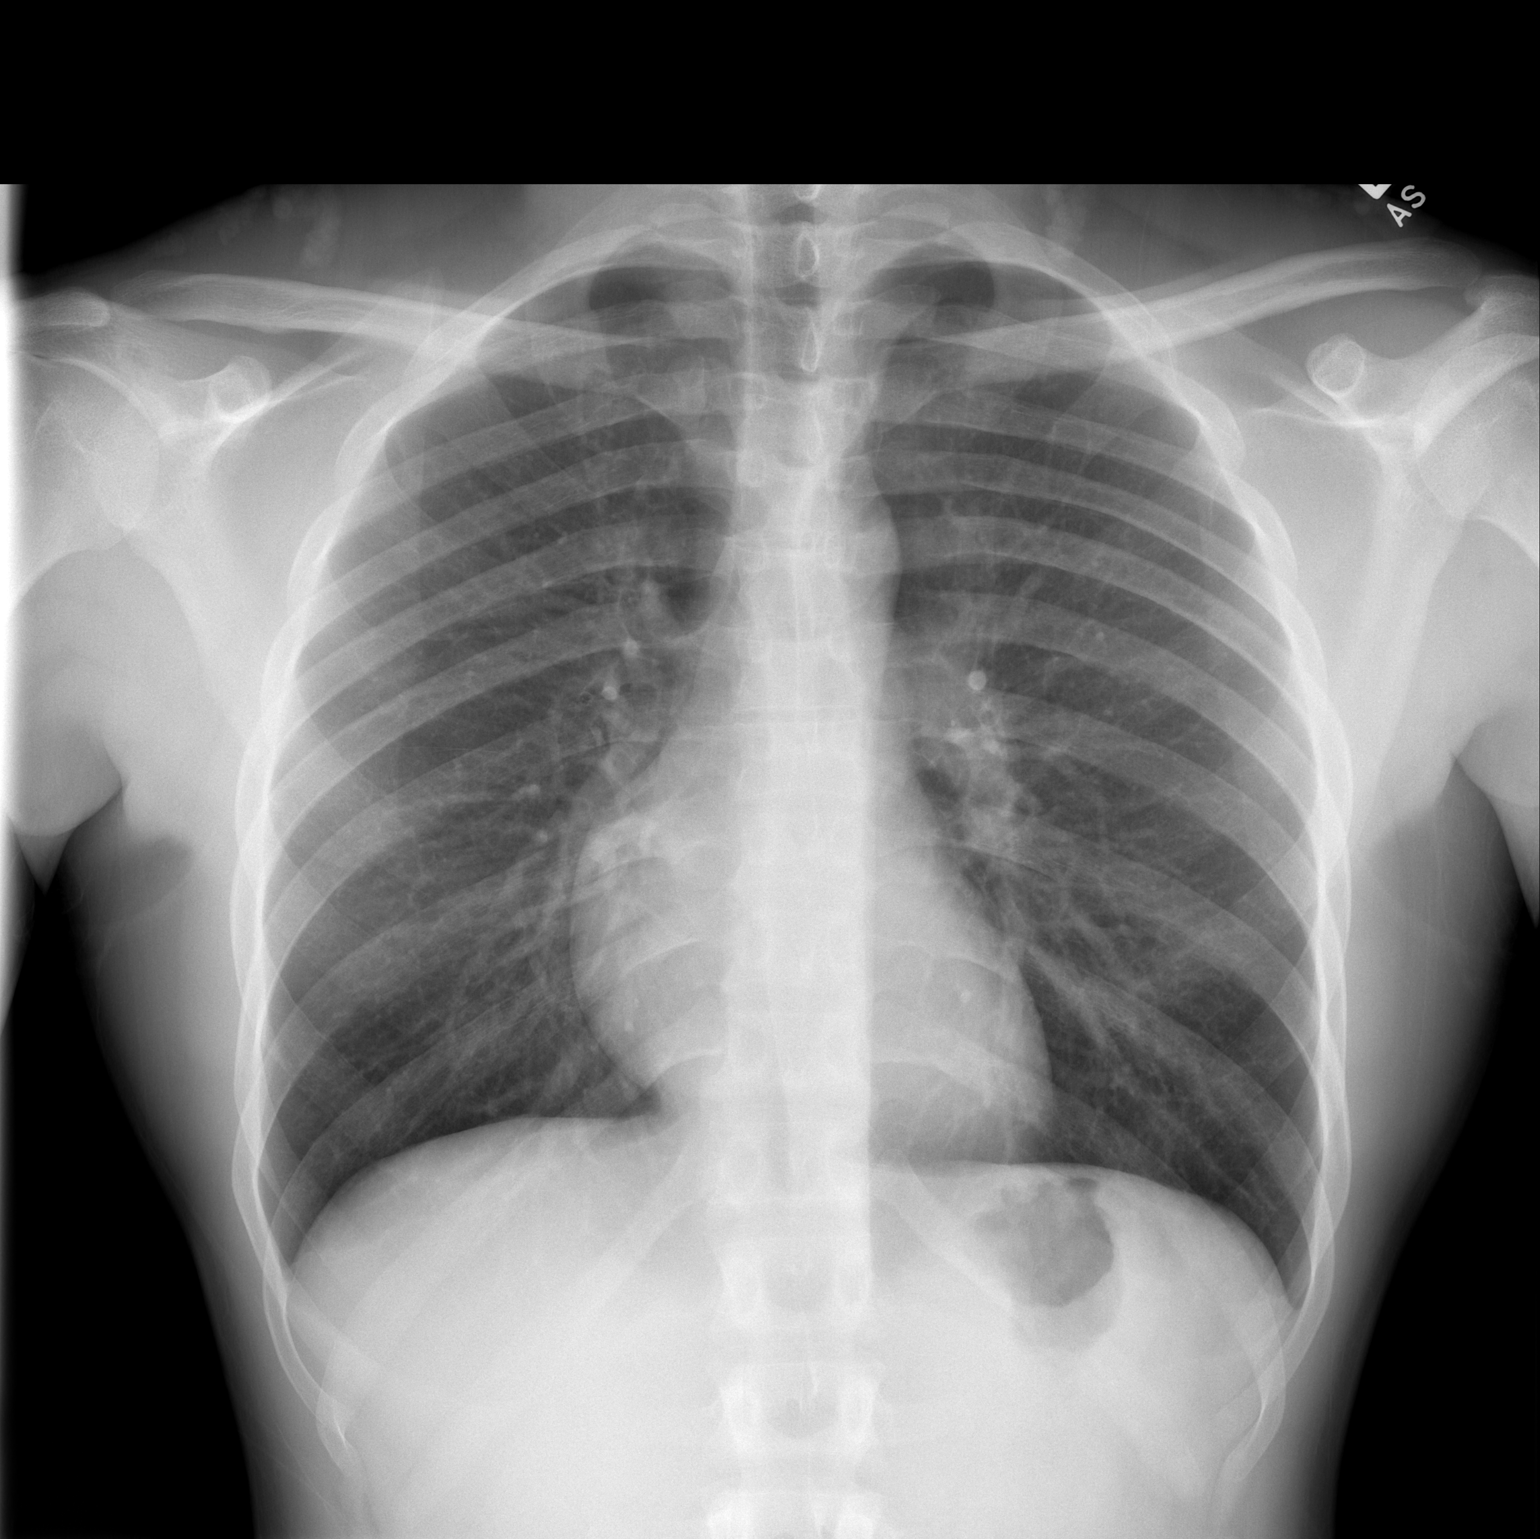

[w chest lat]
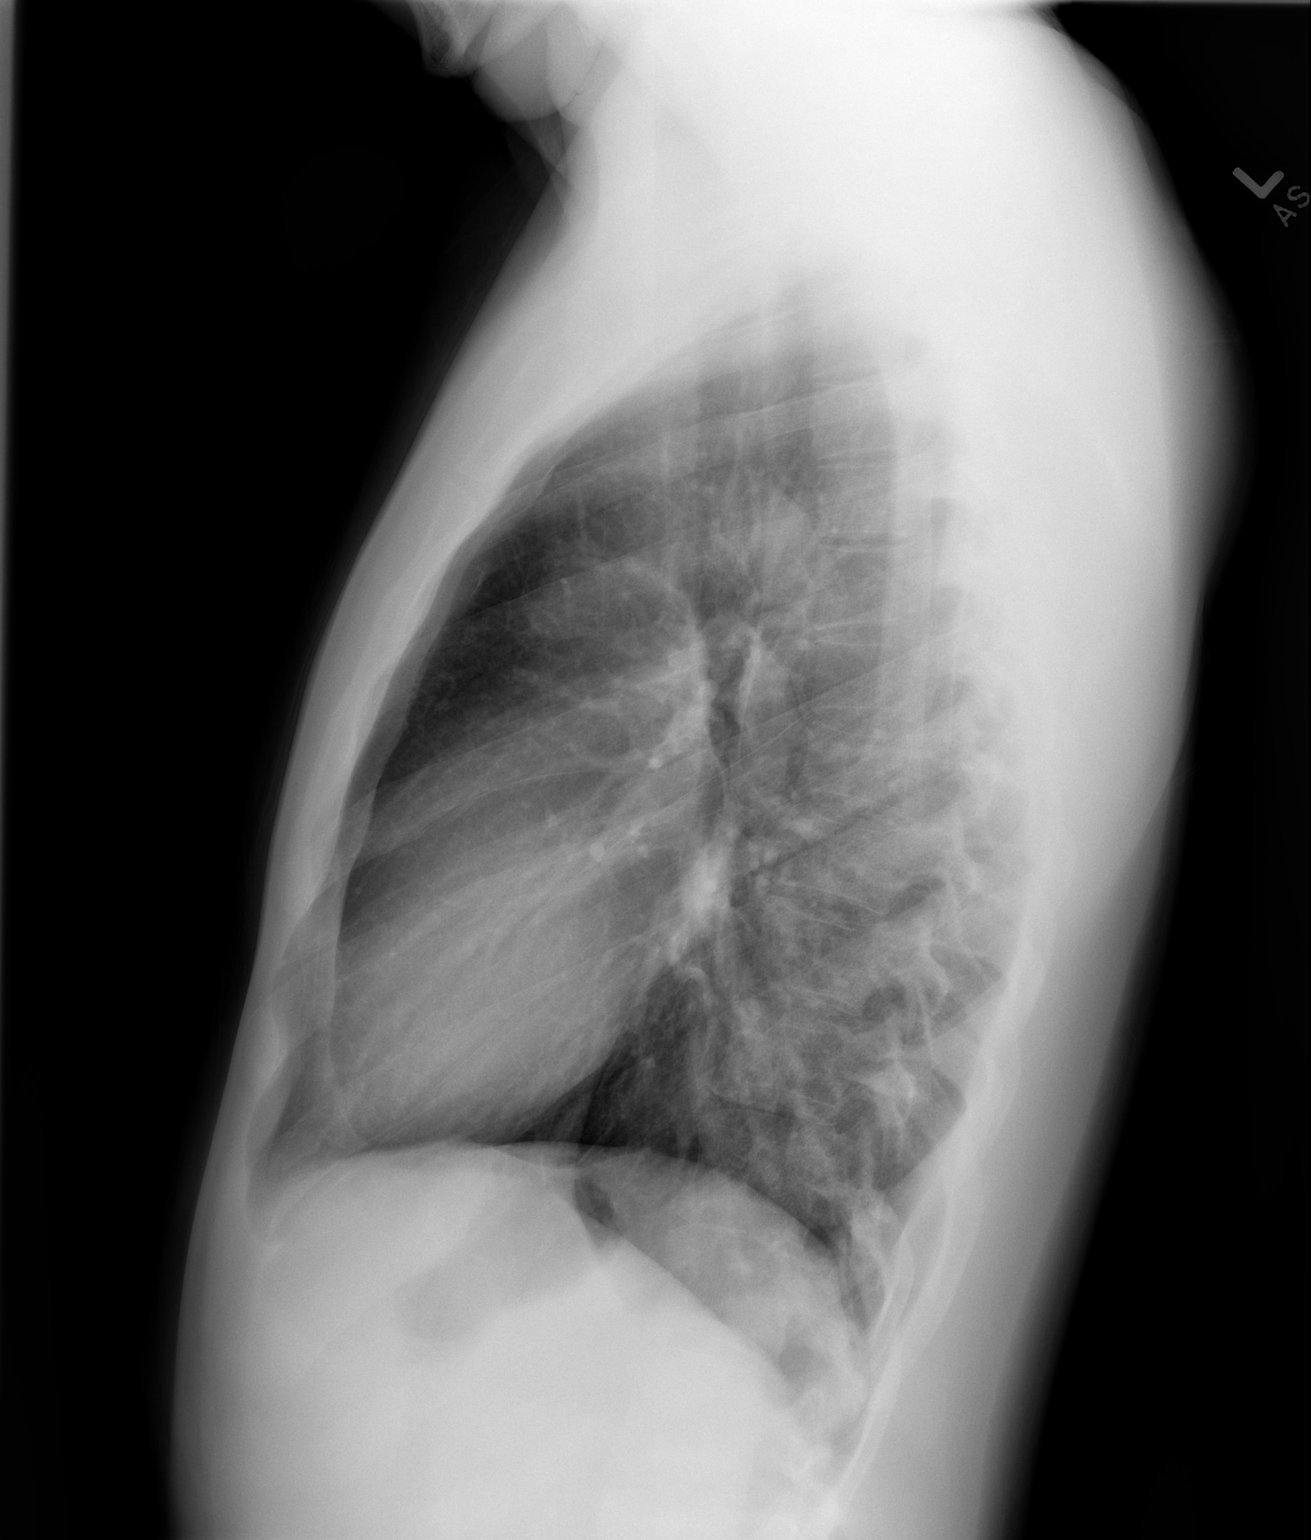

[2 of 2 positions shown; findings below may reference images not displayed]

FINDINGS: The heart size and mediastinal contours are within normal limits.
Both lungs are clear. The visualized skeletal structures are
unremarkable.
IMPRESSION: No acute abnormality of the lungs. No radiographic evidence of
lymphadenopathy in the chest per report of night sweats.

## 2024-01-17 ENCOUNTER — Ambulatory Visit (INDEPENDENT_AMBULATORY_CARE_PROVIDER_SITE_OTHER)

## 2024-01-17 ENCOUNTER — Encounter (HOSPITAL_COMMUNITY): Payer: Self-pay

## 2024-01-17 ENCOUNTER — Ambulatory Visit (HOSPITAL_COMMUNITY)
Admission: EM | Admit: 2024-01-17 | Discharge: 2024-01-17 | Disposition: A | Discharge status: Home / Self Care | Attending: Family Medicine | Admitting: Family Medicine

## 2024-01-17 DIAGNOSIS — R0789 Other chest pain: Secondary | ICD-10-CM

## 2024-01-17 DIAGNOSIS — R202 Paresthesia of skin: Secondary | ICD-10-CM | POA: Diagnosis not present

## 2024-01-17 MED ORDER — IBUPROFEN 800 MG PO TABS: 800.0000 mg | ORAL_TABLET | Freq: Three times a day (TID) | ORAL | 0 refills | Status: AC

## 2024-01-17 NOTE — ED Triage Notes (Signed)
 Patient here today with c/o chest pain and left side arm pain X 2 weeks. Denies taking anything for symptoms.

## 2024-01-17 NOTE — Discharge Instructions (Signed)
 Your exam, EKG, and chest x-ray are all reassuring today. As of right now I believe your pain is likely muscular in nature. Alternate between ibuprofen  and Tylenol as needed for pain. Alternate between ice and heat and do some gentle stretching to help with pain. Follow-up with your primary care provider for further evaluation if your symptoms continue If you develop worsening chest pain, shortness of breath, severe dizziness, severe weakness, or passing out please seek immediate medical treatment in the emergency department.

## 2024-01-17 NOTE — ED Provider Notes (Signed)
 [facility]    CSN: [id number] Arrival date & time: 01/17/24  1617      History   Chief Complaint Chief Complaint  Patient presents with   Chest Pain    HPI Johnny Garza is a 28 y.o. male.   Patient presents with  left-sided chest and left arm pain for about 2 weeks.  Patient states that this has been intermittent over the last 2 weeks, but has progressively worsened.  Patient states that at times the chest pain is so severe that he feels like he is short of breath.  Patient denies dizziness, weakness, recent cough or congestion.  Patient also denies any recent falls or injuries.  Patient denies taking any medication for symptoms.  Patient states that at times he also has some numbness that radiates down his left arm and also states that he feels some numbness to the left side of his chest as well.  Patient states that when he palpates the left side of his chest this sensation is different than his right.  Patient does report a history of asthma, but states that he has not had issues with this in years.  The history is provided by the patient and medical records.  Chest Pain   Past Medical History:  Diagnosis Date   Asthma    Hypospadias     There are no active problems to display for this patient.   Past Surgical History:  Procedure Laterality Date   COSMETIC SURGERY     FINGER SURGERY     HYPOSPADIAS CORRECTION         Home Medications    Prior to Admission medications   Medication Sig Start Date End Date Taking? Authorizing Provider  ibuprofen  (ADVIL ) 800 MG tablet Take 1 tablet (800 mg total) by mouth 3 (three) times daily. 01/17/24  Yes Johnie Rumaldo LABOR, NP    Family History Family History  Problem Relation Age of Onset   Healthy Mother    Healthy Father     Social History Social History   Tobacco Use   Smoking status: Every Day    Types: Cigars   Smokeless tobacco: Never  Vaping Use   Vaping status: Never Used  Substance Use  Topics   Alcohol use: Yes    Comment: occasionally   Drug use: Yes    Types: Marijuana    Comment: Daily.      Allergies   Patient has no known allergies.   Review of Systems Review of Systems  Cardiovascular:  Positive for chest pain.   Per HPI  Physical Exam Triage Vital Signs ED Triage Vitals  Encounter Vitals Group     BP 01/17/24 1625 130/85     Girls Systolic BP Percentile --      Girls Diastolic BP Percentile --      Boys Systolic BP Percentile --      Boys Diastolic BP Percentile --      Pulse Rate 01/17/24 1625 82     Resp 01/17/24 1625 16     Temp 01/17/24 1625 98.4 F (36.9 C)     Temp Source 01/17/24 1625 Oral     SpO2 01/17/24 1625 98 %     Weight --      Height --      Head Circumference --      Peak Flow --      Pain Score 01/17/24 1623 7     Pain Loc --  Pain Education --      Exclude from Growth Chart --    No data found.  Updated Vital Signs BP 130/85 (BP Location: Right Arm)   Pulse 82   Temp 98.4 F (36.9 C) (Oral)   Resp 16   SpO2 98%   Visual Acuity Right Eye Distance:   Left Eye Distance:   Bilateral Distance:    Right Eye Near:   Left Eye Near:    Bilateral Near:     Physical Exam Vitals and nursing note reviewed.  Constitutional:      General: He is awake. He is not in acute distress.    Appearance: Normal appearance. He is well-developed and well-groomed. He is not ill-appearing.  Cardiovascular:     Rate and Rhythm: Normal rate and regular rhythm.     Pulses: Normal pulses.     Heart sounds: Normal heart sounds.  Pulmonary:     Effort: Pulmonary effort is normal.     Breath sounds: Normal breath sounds.  Chest:     Chest wall: Tenderness present.       Comments: Mild tenderness noted to left side of chest.  Reports a weird tingling sensation upon palpation to his chest wall as well Skin:    General: Skin is warm and dry.  Neurological:     General: No focal deficit present.     Mental Status: He is alert  and oriented to person, place, and time. Mental status is at baseline.  Psychiatric:        Behavior: Behavior is cooperative.      UC Treatments / Results  Labs (all labs ordered are listed, but only abnormal results are displayed) Labs Reviewed - No data to display  EKG   Radiology DG Chest 2 View Result Date: 01/17/2024 CLINICAL DATA:  left sided chest pain, worse with deep breathing EXAM: CHEST - 2 VIEW COMPARISON:  May 3, 23 FINDINGS: No focal airspace consolidation, pleural effusion, or pneumothorax. No cardiomegaly.No acute fracture or destructive lesion. IMPRESSION: No acute cardiopulmonary abnormality. Electronically Signed   By: Rogelia Myers M.D.   On: 01/17/2024 17:29    Procedures Procedures (including critical care time)  Medications Ordered in UC Medications - No data to display  Initial Impression / Assessment and Plan / UC Course  I have reviewed the triage vital signs and the nursing notes.  Pertinent labs & imaging results that were available during my care of the patient were reviewed by me and considered in my medical decision making (see chart for details).     Patient is overall well-appearing.  Vitals are stable.  Mild tenderness noted to left chest wall and patient also reports a weird tingling sensation upon palpation to left chest wall.  EKG does reveal some nonspecific ST and T wave abnormalities, but does not reveal ST elevation or acute cardiac findings.  Chest x-ray ordered.  I independently interpreted x-ray images and there is no active cardiopulmonary disease.  Radiology report confirms this.  Chest pain likely muscular in nature based on findings in clinic today.  Recommend ibuprofen  and Tylenol as needed for pain.  Discussed follow-up, return, and strict ER precautions. Final Clinical Impressions(s) / UC Diagnoses   Final diagnoses:  Left-sided chest wall pain  Paresthesias     Discharge Instructions      Your exam, EKG, and chest  x-ray are all reassuring today. As of right now I believe your pain is likely muscular in nature. Alternate between ibuprofen   and Tylenol as needed for pain. Alternate between ice and heat and do some gentle stretching to help with pain. Follow-up with your primary care provider for further evaluation if your symptoms continue If you develop worsening chest pain, shortness of breath, severe dizziness, severe weakness, or passing out please seek immediate medical treatment in the emergency department.   ED Prescriptions     Medication Sig Dispense Auth. Provider   ibuprofen  (ADVIL ) 800 MG tablet Take 1 tablet (800 mg total) by mouth 3 (three) times daily. 21 tablet Johnie Flaming A, NP      PDMP not reviewed this encounter.   Johnie Flaming A, NP 01/17/24 1843

## 2024-01-25 ENCOUNTER — Encounter (HOSPITAL_COMMUNITY): Payer: Self-pay | Admitting: Emergency Medicine

## 2024-01-25 ENCOUNTER — Other Ambulatory Visit: Payer: Self-pay

## 2024-01-25 ENCOUNTER — Ambulatory Visit (HOSPITAL_COMMUNITY)
Admission: EM | Admit: 2024-01-25 | Discharge: 2024-01-25 | Disposition: A | Discharge status: Home / Self Care | Attending: Family Medicine | Admitting: Family Medicine

## 2024-01-25 ENCOUNTER — Ambulatory Visit (INDEPENDENT_AMBULATORY_CARE_PROVIDER_SITE_OTHER)

## 2024-01-25 DIAGNOSIS — M898X1 Other specified disorders of bone, shoulder: Secondary | ICD-10-CM

## 2024-01-25 DIAGNOSIS — F32A Depression, unspecified: Secondary | ICD-10-CM | POA: Diagnosis not present

## 2024-01-25 MED ORDER — SERTRALINE HCL 25 MG PO TABS: 25.0000 mg | ORAL_TABLET | Freq: Every day | ORAL | 2 refills | Status: AC
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
## 2024-04-25 ENCOUNTER — Ambulatory Visit
Admission: EM | Admit: 2024-04-25 | Discharge: 2024-04-25 | Disposition: A | Discharge status: Home / Self Care | Attending: Emergency Medicine | Admitting: Emergency Medicine

## 2024-04-25 DIAGNOSIS — Z113 Encounter for screening for infections with a predominantly sexual mode of transmission: Secondary | ICD-10-CM | POA: Insufficient documentation

## 2024-04-25 NOTE — ED Triage Notes (Signed)
 Patient requesting STD screening. Patient denies any current symptoms or known exposure. Patient wanting the blood work as well.

## 2024-04-25 NOTE — Discharge Instructions (Addendum)
 Check my chart for results. Avoid sexual activity until results,treatment known and completed. Safe sex with all future sexual activity. We have sent testing for sexually transmitted infections. We will notify you of any positive results once they are received. If required, we will prescribe any medications you might need.  If your test is negative you will not be notified.    Please refrain from all sexual activity for at least the next seven days.   Follow up with PCP. Return as needed.

## 2024-04-25 NOTE — ED Provider Notes (Signed)
 " [facility]    CSN: [id number] Arrival date & time: 04/25/24  1541      History   Chief Complaint Chief Complaint  Patient presents with   SEXUALLY TRANSMITTED DISEASE    HPI Johnny Garza is Garza 29 y.o. male.   29 year old male, Johnny Garza, presents to urgent care for STI screening.  Patient denies any current symptoms or known exposure, patient requesting blood work as well.  Patient reports new partner.  The history is provided by the patient. No language interpreter was used.    Past Medical History:  Diagnosis Date   Asthma    Hypospadias     Patient Active Problem List   Diagnosis Date Noted   Routine screening for STI (sexually transmitted infection) 04/25/2024    Past Surgical History:  Procedure Laterality Date   COSMETIC SURGERY     FINGER SURGERY     HYPOSPADIAS CORRECTION         Home Medications    Prior to Admission medications  Medication Sig Start Date End Date Taking? Authorizing Provider  ibuprofen  (ADVIL ) 800 MG tablet Take 1 tablet (800 mg total) by mouth 3 (three) times daily. 01/17/24   Johnny Flaming A, NP  sertraline  (ZOLOFT ) 25 MG tablet Take 1 tablet (25 mg total) by mouth daily. 01/25/24   [name], MD    Family History Family History  Problem Relation Age of Onset   Healthy Mother    Healthy Father     Social History Social History[1]   Allergies   Patient has no known allergies.   Review of Systems Review of Systems  Constitutional:  Negative for fever.  Genitourinary:  Negative for dysuria, genital sores, penile discharge, penile pain, penile swelling, scrotal swelling and testicular pain.  All other systems reviewed and are negative.    Physical Exam Triage Vital Signs ED Triage Vitals [04/25/24 1553]  Encounter Vitals Group     BP 121/80     Girls Systolic BP Percentile      Girls Diastolic BP Percentile      Boys Systolic BP Percentile      Boys Diastolic BP Percentile      Pulse  Rate 68     Resp 17     Temp 98.1 F (36.7 C)     Temp Source Oral     SpO2 97 %     Weight      Height      Head Circumference      Peak Flow      Pain Score 0     Pain Loc      Pain Education      Exclude from Growth Chart    No data found.  Updated Vital Signs BP 121/80 (BP Location: Left Arm)   Pulse 68   Temp 98.1 F (36.7 C) (Oral)   Resp 17   SpO2 97%   Visual Acuity Right Eye Distance:   Left Eye Distance:   Bilateral Distance:    Right Eye Near:   Left Eye Near:    Bilateral Near:     Physical Exam Vitals and nursing note reviewed.  Genitourinary:    Comments: Patient self swab Neurological:     General: No focal deficit present.     Mental Status: He is alert and oriented to person, place, and time.     GCS: GCS eye subscore is 4. GCS verbal subscore is 5. GCS motor subscore is 6.  Psychiatric:        Attention and Perception: Attention normal.        Mood and Affect: Mood normal.        Speech: Speech normal.        Behavior: Behavior is cooperative.      UC Treatments / Results  Labs (all labs ordered are listed, but only abnormal results are displayed) Labs Reviewed  SYPHILIS: RPR W/REFLEX TO RPR TITER AND TREPONEMAL ANTIBODIES, TRADITIONAL SCREENING AND DIAGNOSIS ALGORITHM  MISC [facility] TEST (SEND OUT)  CYTOLOGY, (ORAL, ANAL, URETHRAL) ANCILLARY ONLY    EKG   Radiology No results found.  Procedures Procedures (including critical care time)  Medications Ordered in UC Medications - No data to display  Initial Impression / Assessment and Plan / UC Course  I have reviewed the triage vital signs and the nursing notes.  Pertinent labs & imaging results that were available during my care of the patient were reviewed by me and considered in my medical decision making (see chart for details).    Discussed exam findings and plan of care with patient :check my chart for results. Avoid sexual activity until results,treatment known and  completed. Safe sex with all future sexual activity. We have sent testing for sexually transmitted infections. We will notify you of any positive results once they are received. If required, we will prescribe any medications you might need.  If your test is negative you will not be notified.  Patient verbalized understanding to this provider  Ddx: Possible STI exposure, routine screening, worried well Final Clinical Impressions(s) / UC Diagnoses   Final diagnoses:  Routine screening for STI (sexually transmitted infection)     Discharge Instructions      Check my chart for results. Avoid sexual activity until results,treatment known and completed. Safe sex with all future sexual activity. We have sent testing for sexually transmitted infections. We will notify you of any positive results once they are received. If required, we will prescribe any medications you might need.  If your test is negative you will not be notified.    Please refrain from all sexual activity for at least the next seven days.   Follow up with PCP. Return as needed.     ED Prescriptions   None    PDMP not reviewed this encounter.     [1]  Social History Tobacco Use   Smoking status: Some Days    Types: Cigars   Smokeless tobacco: Never  Vaping Use   Vaping status: Never Used  Substance Use Topics   Alcohol use: Yes    Comment: occasionally   Drug use: Yes    Types: Marijuana    Comment: Daily.      Aminta Loose, NP 04/25/24 2130  "

## 2024-04-26 LAB — CYTOLOGY, (ORAL, ANAL, URETHRAL) ANCILLARY ONLY
Chlamydia: NEGATIVE
Comment: NEGATIVE
Comment: NEGATIVE
Comment: NORMAL
Neisseria Gonorrhea: NEGATIVE
Trichomonas: NEGATIVE

## 2024-04-26 LAB — SYPHILIS: RPR W/REFLEX TO RPR TITER AND TREPONEMAL ANTIBODIES, TRADITIONAL SCREENING AND DIAGNOSIS ALGORITHM: RPR Ser Ql: NONREACTIVE

## 2024-04-29 LAB — MISC LABCORP TEST (SEND OUT): Labcorp test code: 83935
